# Patient Record
Sex: Male | Born: 1960 | Hispanic: Yes | Marital: Married | Smoking: Never smoker
Health system: Southern US, Community
[De-identification: ages and names within clinical notes are randomized; demographics above are authoritative.]

## PROBLEM LIST (undated history)

## (undated) DIAGNOSIS — E039 Hypothyroidism, unspecified: Secondary | ICD-10-CM

## (undated) DIAGNOSIS — I10 Essential (primary) hypertension: Secondary | ICD-10-CM

## (undated) DIAGNOSIS — E119 Type 2 diabetes mellitus without complications: Secondary | ICD-10-CM

## (undated) DIAGNOSIS — Z87442 Personal history of urinary calculi: Secondary | ICD-10-CM

## (undated) DIAGNOSIS — R0600 Dyspnea, unspecified: Secondary | ICD-10-CM

## (undated) DIAGNOSIS — J45909 Unspecified asthma, uncomplicated: Secondary | ICD-10-CM

## (undated) DIAGNOSIS — K219 Gastro-esophageal reflux disease without esophagitis: Secondary | ICD-10-CM

## (undated) DIAGNOSIS — D126 Benign neoplasm of colon, unspecified: Secondary | ICD-10-CM

## (undated) DIAGNOSIS — D869 Sarcoidosis, unspecified: Secondary | ICD-10-CM

---

## 2011-08-15 DIAGNOSIS — E039 Hypothyroidism, unspecified: Secondary | ICD-10-CM

## 2011-08-15 HISTORY — DX: Hypothyroidism, unspecified: E03.9

## 2013-11-07 ENCOUNTER — Ambulatory Visit (INDEPENDENT_AMBULATORY_CARE_PROVIDER_SITE_OTHER): Payer: BC Managed Care – PPO | Admitting: Podiatry

## 2013-11-07 ENCOUNTER — Encounter: Payer: Self-pay | Admitting: Podiatry

## 2013-11-07 ENCOUNTER — Ambulatory Visit (INDEPENDENT_AMBULATORY_CARE_PROVIDER_SITE_OTHER): Payer: BC Managed Care – PPO

## 2013-11-07 VITALS — BP 136/87 | HR 73 | Resp 16 | Ht 65.0 in | Wt 214.0 lb

## 2013-11-07 DIAGNOSIS — M79672 Pain in left foot: Secondary | ICD-10-CM

## 2013-11-07 DIAGNOSIS — M722 Plantar fascial fibromatosis: Secondary | ICD-10-CM

## 2013-11-07 DIAGNOSIS — M79609 Pain in unspecified limb: Secondary | ICD-10-CM

## 2013-11-07 MED ORDER — TRIAMCINOLONE ACETONIDE 10 MG/ML IJ SUSP
10.0000 mg | Freq: Once | INTRAMUSCULAR | Status: AC
  Administered 2013-11-07: 10 mg

## 2013-11-07 MED ORDER — DICLOFENAC SODIUM 75 MG PO TBEC: 75.0000 mg | DELAYED_RELEASE_TABLET | Freq: Two times a day (BID) | ORAL | Status: DC

## 2013-11-07 NOTE — Progress Notes (Signed)
The right heel and the left ball of foot is hurting

## 2013-11-07 NOTE — Progress Notes (Signed)
Subjective:     Patient ID: Andrew Elliott, male   DOB: 11/02/1960, 53 y.o.   MRN: [id number]  HPI patient states I'm having a lot of pain in my right heel began and I think I need something to wear in my shoes. Also states the left heel has been hurting somewhat   Review of Systems     Objective:   Physical Exam Neurovascular status intact with no health history changes noted and discomfort in the right plantar heel at the insertional point of the tendon into the calcaneus    Assessment:     Continue plantar fasciitis of the heel right over left    Plan:     Reinjected the right plantar fascia 3 mg Kenalog 5 mg Xylocaine Marcaine mixture placed on Voltaren 75 mg twice a day and scanned for custom orthotics to reduce the stress against the heel

## 2013-11-21 ENCOUNTER — Ambulatory Visit (INDEPENDENT_AMBULATORY_CARE_PROVIDER_SITE_OTHER): Payer: BC Managed Care – PPO | Admitting: Podiatry

## 2013-11-21 DIAGNOSIS — M722 Plantar fascial fibromatosis: Secondary | ICD-10-CM

## 2013-11-21 NOTE — Patient Instructions (Signed)

## 2013-11-21 NOTE — Progress Notes (Signed)
Pt presents for orthotic pick up , dispensed orthotics with written and verbal instructions

## 2013-12-16 ENCOUNTER — Ambulatory Visit: Payer: BC Managed Care – PPO | Admitting: Podiatry

## 2013-12-23 ENCOUNTER — Ambulatory Visit (INDEPENDENT_AMBULATORY_CARE_PROVIDER_SITE_OTHER): Payer: BC Managed Care – PPO | Admitting: Podiatry

## 2013-12-23 ENCOUNTER — Encounter: Payer: Self-pay | Admitting: Podiatry

## 2013-12-23 VITALS — BP 122/86 | HR 72 | Resp 16

## 2013-12-23 DIAGNOSIS — M722 Plantar fascial fibromatosis: Secondary | ICD-10-CM

## 2013-12-23 MED ORDER — TRIAMCINOLONE ACETONIDE 10 MG/ML IJ SUSP
10.0000 mg | Freq: Once | INTRAMUSCULAR | Status: AC
  Administered 2013-12-23: 10 mg

## 2013-12-24 NOTE — Progress Notes (Signed)
Subjective:     Patient ID: Andrew Elliott, male   DOB: [birth year], 53 y.o.   MRN: [id number]  HPI patient presents stating my left heel is hurting and at this time my right foot feels better. Patient is relatively difficult to communicate with him points to his arch and his heel left   Review of Systems     Objective:   Physical Exam Neurovascular status intact with no other health history changes noted and discomfort in the left plantar heel at the insertion and moderate pain into the arch of the left    Assessment:     Plantar fasciitis left with arch pain also noted    Plan:     We are continuing to work with this conservatively and today a reinjected the plantar fascia 3 mg Kenalog 5 mm Xylocaine Marcaine mixture encouraged him to break his orthotics and proper leg and continue with his oral anti-inflammatory. Reappoint in 4 weeks

## 2013-12-30 DIAGNOSIS — E039 Hypothyroidism, unspecified: Secondary | ICD-10-CM | POA: Insufficient documentation

## 2014-01-07 ENCOUNTER — Ambulatory Visit: Payer: Self-pay | Admitting: Internal Medicine

## 2014-01-23 ENCOUNTER — Ambulatory Visit: Payer: BC Managed Care – PPO | Admitting: Podiatry

## 2014-01-30 DIAGNOSIS — N433 Hydrocele, unspecified: Secondary | ICD-10-CM | POA: Insufficient documentation

## 2014-10-30 ENCOUNTER — Ambulatory Visit (INDEPENDENT_AMBULATORY_CARE_PROVIDER_SITE_OTHER): Payer: BLUE CROSS/BLUE SHIELD

## 2014-10-30 ENCOUNTER — Ambulatory Visit (INDEPENDENT_AMBULATORY_CARE_PROVIDER_SITE_OTHER): Payer: BLUE CROSS/BLUE SHIELD | Admitting: Podiatry

## 2014-10-30 VITALS — BP 122/81 | HR 67 | Resp 16

## 2014-10-30 DIAGNOSIS — M722 Plantar fascial fibromatosis: Secondary | ICD-10-CM | POA: Diagnosis not present

## 2014-10-30 MED ORDER — TRIAMCINOLONE ACETONIDE 10 MG/ML IJ SUSP
10.0000 mg | Freq: Once | INTRAMUSCULAR | Status: AC
  Administered 2014-10-30: 10 mg

## 2014-10-30 NOTE — Progress Notes (Signed)
Subjective:     Patient ID: Andrew Elliott, male   DOB: 03/13/1961, 54 y.o.   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 Reappoint to recheck again if symptoms indicate

## 2014-11-13 ENCOUNTER — Encounter: Payer: Self-pay | Admitting: Podiatry

## 2015-03-12 DIAGNOSIS — K219 Gastro-esophageal reflux disease without esophagitis: Secondary | ICD-10-CM | POA: Insufficient documentation

## 2015-06-07 ENCOUNTER — Ambulatory Visit
Admission: RE | Admit: 2015-06-07 | Discharge: 2015-06-07 | Disposition: A | Discharge status: Home / Self Care | Payer: BLUE CROSS/BLUE SHIELD | Source: Ambulatory Visit | Attending: Family Medicine | Admitting: Family Medicine

## 2015-06-07 ENCOUNTER — Other Ambulatory Visit: Payer: Self-pay | Admitting: Family Medicine

## 2015-06-07 DIAGNOSIS — R042 Hemoptysis: Secondary | ICD-10-CM

## 2015-06-18 ENCOUNTER — Other Ambulatory Visit: Payer: Self-pay | Admitting: Internal Medicine

## 2015-06-18 DIAGNOSIS — E559 Vitamin D deficiency, unspecified: Secondary | ICD-10-CM | POA: Insufficient documentation

## 2015-06-18 DIAGNOSIS — R7401 Elevation of levels of liver transaminase levels: Secondary | ICD-10-CM

## 2015-06-18 DIAGNOSIS — R7303 Prediabetes: Secondary | ICD-10-CM | POA: Insufficient documentation

## 2015-06-18 DIAGNOSIS — E781 Pure hyperglyceridemia: Secondary | ICD-10-CM | POA: Insufficient documentation

## 2015-06-18 DIAGNOSIS — R740 Nonspecific elevation of levels of transaminase and lactic acid dehydrogenase [LDH]: Principal | ICD-10-CM

## 2015-06-22 ENCOUNTER — Ambulatory Visit
Admission: RE | Admit: 2015-06-22 | Discharge: 2015-06-22 | Disposition: A | Discharge status: Home / Self Care | Payer: BLUE CROSS/BLUE SHIELD | Source: Ambulatory Visit | Attending: Internal Medicine | Admitting: Internal Medicine

## 2015-06-22 DIAGNOSIS — R7401 Elevation of levels of liver transaminase levels: Secondary | ICD-10-CM

## 2015-06-22 DIAGNOSIS — R740 Nonspecific elevation of levels of transaminase and lactic acid dehydrogenase [LDH]: Secondary | ICD-10-CM | POA: Insufficient documentation

## 2015-06-22 IMAGING — US US ABDOMEN LIMITED
1 series · 14 of 25 positions shown · non-contrast
Comparison: None in PACs

CLINICAL DATA: Elevated liver function studies

EXAM:
US ABDOMEN LIMITED - RIGHT UPPER QUADRANT

[Series 1: us abdomen limited · 0.20mm/px · 14 of 77 slices shown]
[im 1/77]
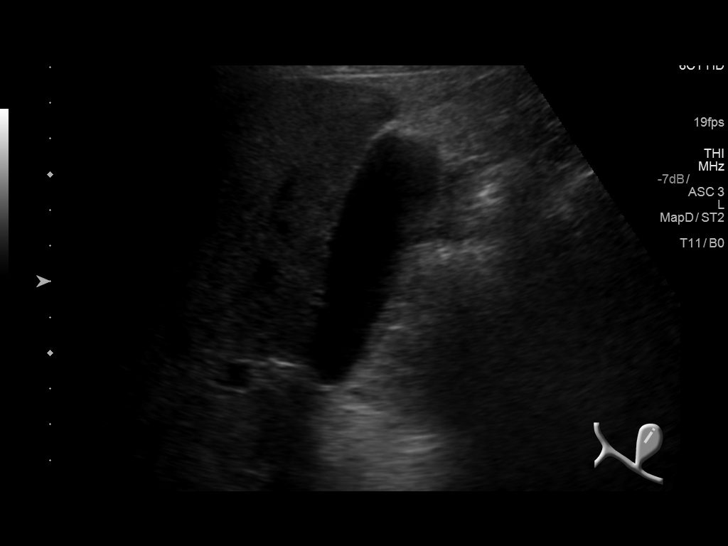
[im 7/77]
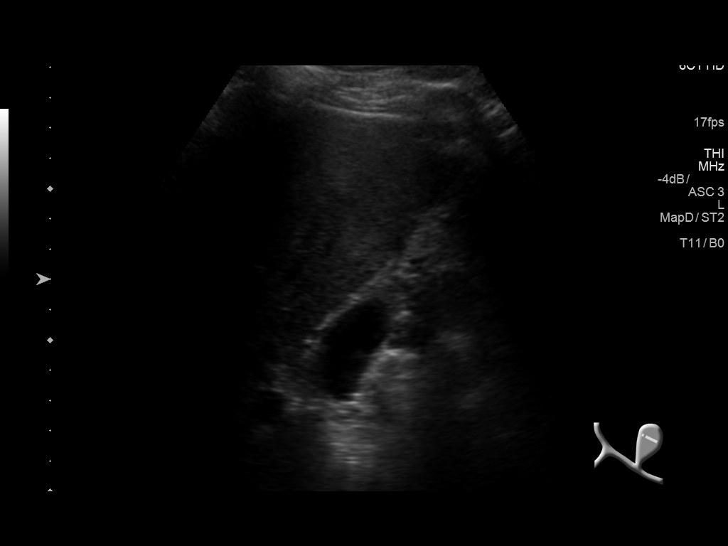
[im 13/77]
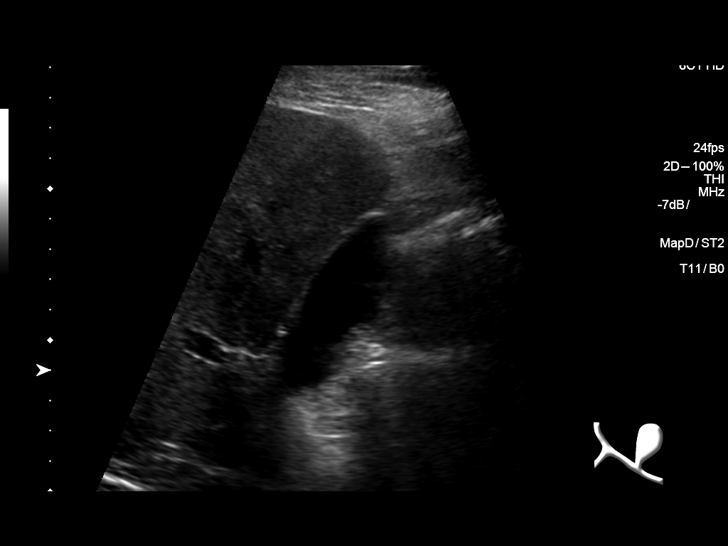
[im 20/77]
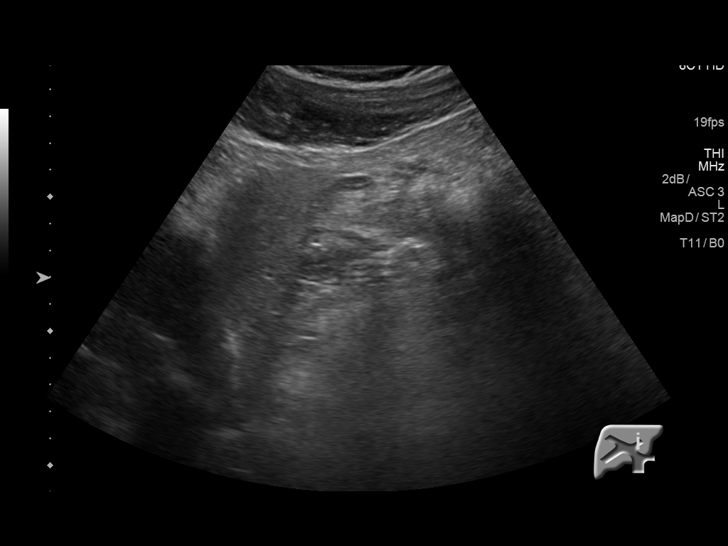
[im 26/77]
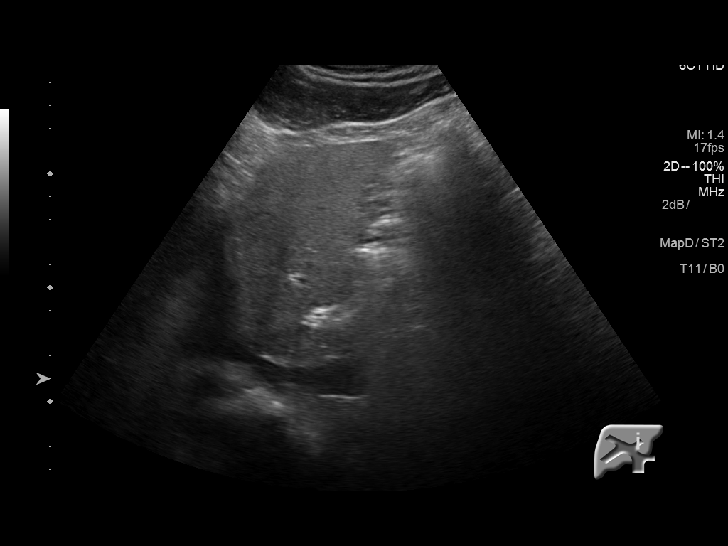
[im 29/77]
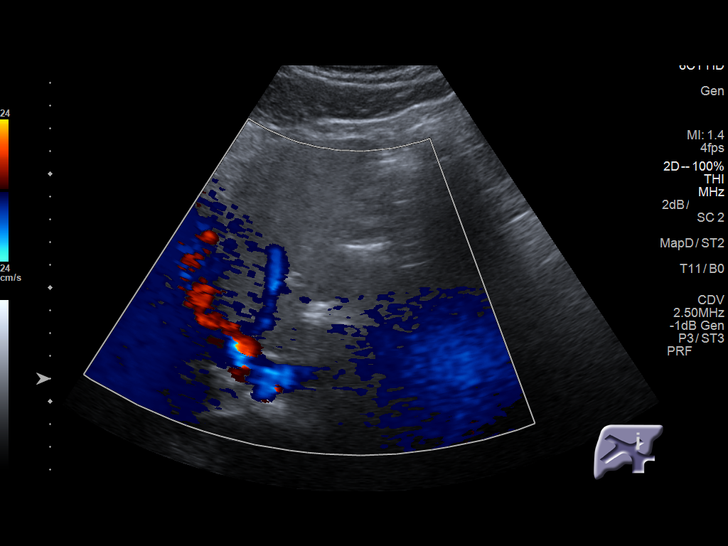
[im 35/77]
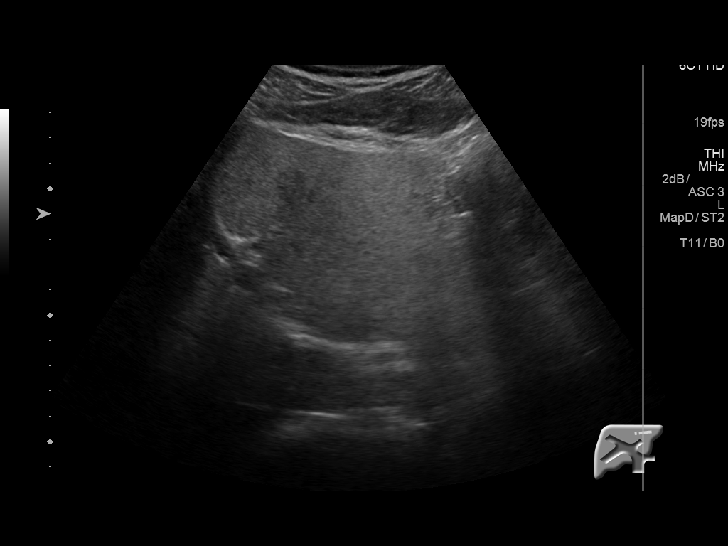
[im 42/77]
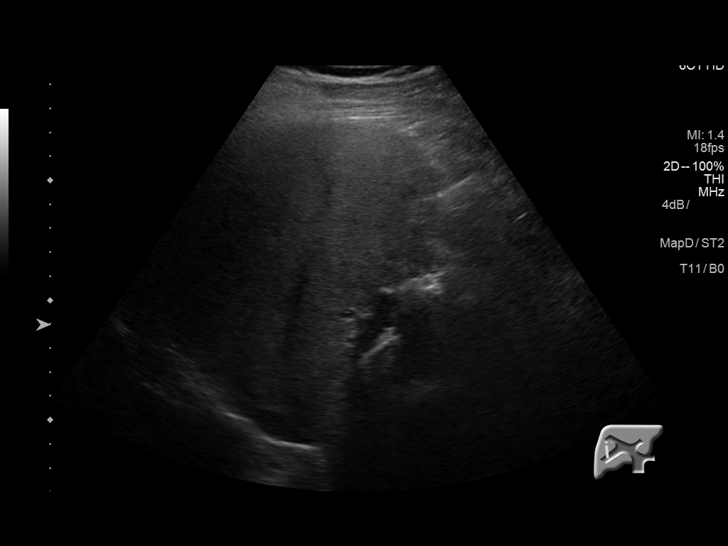
[im 48/77]
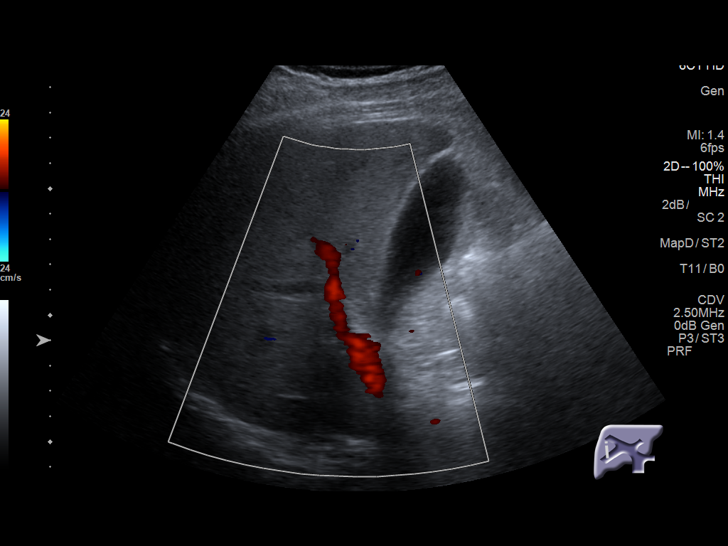
[im 51/77]
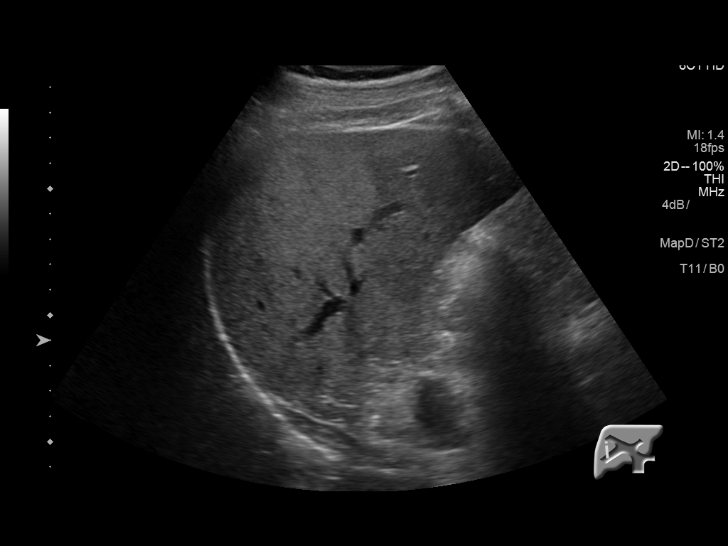
[im 58/77]
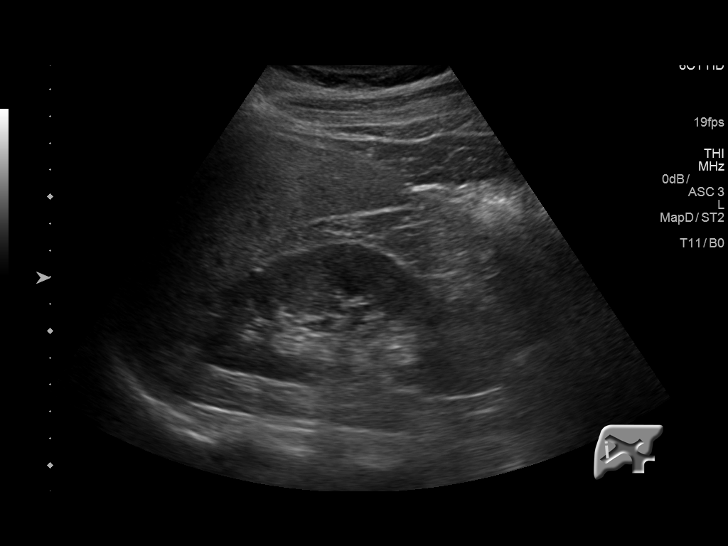
[im 64/77]
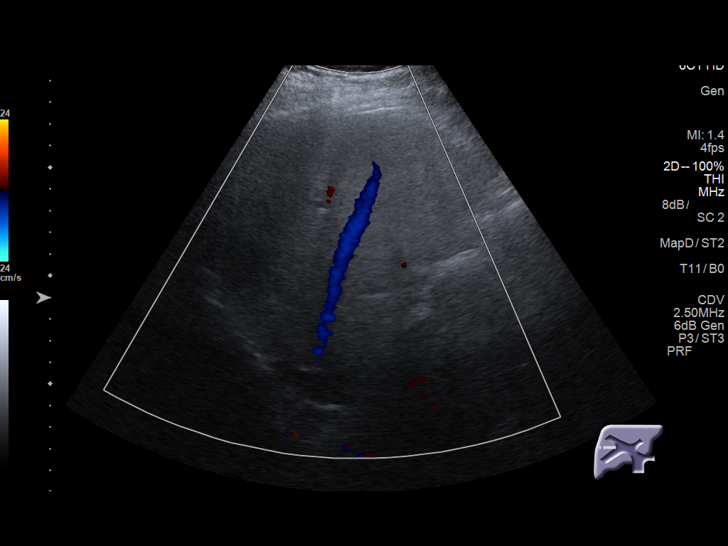
[im 70/77]
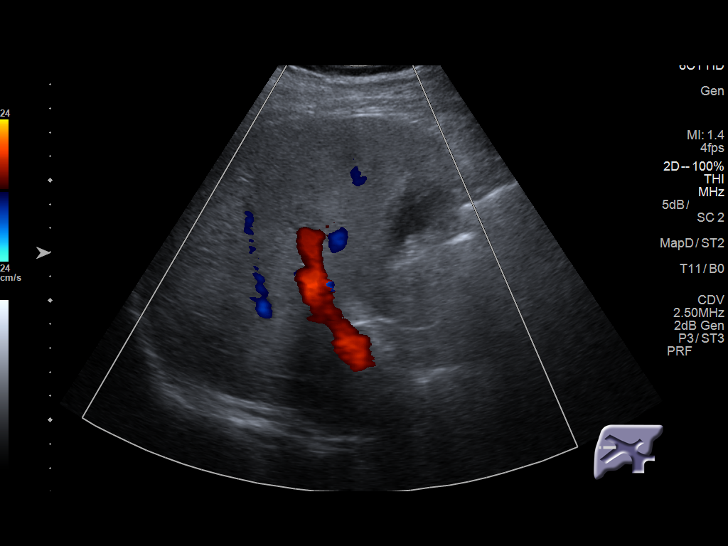
[im 77/77]
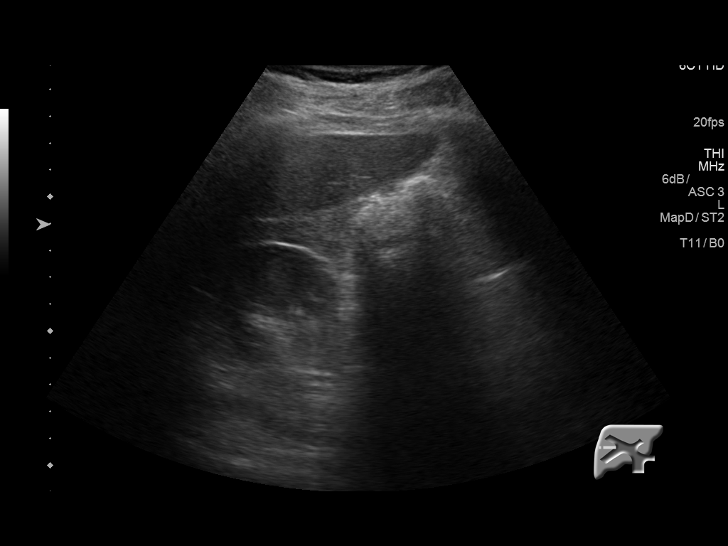

[14 of 25 positions shown; findings below may reference images not displayed]

FINDINGS: Gallbladder:

No gallstones or wall thickening visualized. No sonographic Murphy
sign noted.

Common bile duct:

Diameter: 4 mm

Liver:

The hepatic echotexture is mildly increased diffusely. There is no
focal mass nor ductal dilation.
IMPRESSION: Fatty infiltrative change of the liver. There is no focal hepatic
abnormality. The gallbladder and common bile duct are unremarkable.

## 2015-07-24 ENCOUNTER — Emergency Department
Admission: EM | Admit: 2015-07-24 | Discharge: 2015-07-24 | Disposition: A | Discharge status: Home / Self Care | Payer: Worker's Compensation | Attending: Emergency Medicine | Admitting: Emergency Medicine

## 2015-07-24 ENCOUNTER — Emergency Department: Payer: Worker's Compensation

## 2015-07-24 ENCOUNTER — Encounter: Payer: Self-pay | Admitting: Emergency Medicine

## 2015-07-24 DIAGNOSIS — S0003XA Contusion of scalp, initial encounter: Secondary | ICD-10-CM | POA: Insufficient documentation

## 2015-07-24 DIAGNOSIS — Z791 Long term (current) use of non-steroidal anti-inflammatories (NSAID): Secondary | ICD-10-CM | POA: Insufficient documentation

## 2015-07-24 DIAGNOSIS — S0993XA Unspecified injury of face, initial encounter: Secondary | ICD-10-CM | POA: Insufficient documentation

## 2015-07-24 DIAGNOSIS — W1789XA Other fall from one level to another, initial encounter: Secondary | ICD-10-CM | POA: Diagnosis not present

## 2015-07-24 DIAGNOSIS — Y990 Civilian activity done for income or pay: Secondary | ICD-10-CM | POA: Diagnosis not present

## 2015-07-24 DIAGNOSIS — S0990XA Unspecified injury of head, initial encounter: Secondary | ICD-10-CM | POA: Insufficient documentation

## 2015-07-24 DIAGNOSIS — S0083XA Contusion of other part of head, initial encounter: Secondary | ICD-10-CM | POA: Insufficient documentation

## 2015-07-24 DIAGNOSIS — Z880 Allergy status to penicillin: Secondary | ICD-10-CM | POA: Diagnosis not present

## 2015-07-24 DIAGNOSIS — S199XXA Unspecified injury of neck, initial encounter: Secondary | ICD-10-CM | POA: Diagnosis present

## 2015-07-24 DIAGNOSIS — S161XXA Strain of muscle, fascia and tendon at neck level, initial encounter: Secondary | ICD-10-CM | POA: Diagnosis not present

## 2015-07-24 DIAGNOSIS — Y9389 Activity, other specified: Secondary | ICD-10-CM | POA: Insufficient documentation

## 2015-07-24 DIAGNOSIS — Y9289 Other specified places as the place of occurrence of the external cause: Secondary | ICD-10-CM | POA: Insufficient documentation

## 2015-07-24 DIAGNOSIS — Z79899 Other long term (current) drug therapy: Secondary | ICD-10-CM | POA: Diagnosis not present

## 2015-07-24 DIAGNOSIS — R52 Pain, unspecified: Secondary | ICD-10-CM

## 2015-07-24 MED ORDER — IBUPROFEN 600 MG PO TABS
600.0000 mg | ORAL_TABLET | Freq: Once | ORAL | Status: AC
  Administered 2015-07-24: 600 mg via ORAL
  Filled 2015-07-24: qty 1

## 2015-07-24 MED ORDER — IBUPROFEN 800 MG PO TABS: 800.0000 mg | ORAL_TABLET | Freq: Three times a day (TID) | ORAL | Status: DC

## 2015-07-24 NOTE — ED Provider Notes (Signed)
[facility] Emergency Department Provider Note  ____________________________________________  Time seen: Approximately 10:59 AM  I have reviewed the triage vital signs and the nursing notes.   HISTORY  Chief Complaint Fall   HPI Andrew Elliott is a 54 y.o. male is here with complaint of neck and right jaw pain. Patient also complains of headache and head pain posteriorly and feels dizzy. Patient states that he fell at work yesterday. Dr. Luiz Blare who is the company doctor called the ED prior to patient's arrival stating that patient fell approximately 6 feet onto concrete. Patient states that there was loss of consciousness but denies any nausea, vomiting, visual changes or confusion. Patient states that last evening he took Tylenol and again this morning without any relief. Currently he denies any visual problems and patient is ambulatory in the ER. Currently he rates his pain as a 9 out of 10. He denies any paresthesias.   History reviewed. No pertinent past medical history.  Patient Active Problem List   Diagnosis Date Noted  . Hydrocele 01/30/2014  . Adult hypothyroidism 12/30/2013    History reviewed. No pertinent past surgical history.  Current Outpatient Rx  Name  Route  Sig  Dispense  Refill  . etodolac (LODINE) 500 MG tablet   Oral   Take 500 mg by mouth 2 (two) times daily. As needed         . traMADol (ULTRAM) 50 MG tablet   Oral   Take by mouth every 12 (twelve) hours as needed for moderate pain.         Marland Kitchen diclofenac (VOLTAREN) 75 MG EC tablet   Oral   Take 1 tablet (75 mg total) by mouth 2 (two) times daily.   50 tablet   2   . ibuprofen (ADVIL,MOTRIN) 800 MG tablet   Oral   Take 1 tablet (800 mg total) by mouth 3 (three) times daily.   30 tablet   0   . levothyroxine (SYNTHROID, LEVOTHROID) 75 MCG tablet   Oral   Take by mouth.           Allergies Amoxicillin  No family history on file.  Social History Social  History  Substance Use Topics  . Smoking status: Never Smoker   . Smokeless tobacco: Never Used  . Alcohol Use: No    Review of Systems Constitutional: No fever/chills Eyes: No visual changes. ENT: Right mandible pain positive Cardiovascular: Denies chest pain. Respiratory: Denies shortness of breath. Gastrointestinal:   No nausea, no vomiting.  Musculoskeletal: Negative for back pain. Positive cervical spine pain, positive posterior scalp. Skin: Negative for rash. Neurological: Positive for headaches, no focal weakness or numbness.  10-point ROS otherwise negative.  ____________________________________________   PHYSICAL EXAM:  VITAL SIGNS: ED Triage Vitals  Enc Vitals Group     BP 07/24/15 1012 138/88 mmHg     Pulse Rate 07/24/15 1012 65     Resp 07/24/15 1012 18     Temp 07/24/15 1012 98.2 F (36.8 C)     Temp Source 07/24/15 1012 Oral     SpO2 07/24/15 1012 100 %     Weight 07/24/15 1012 200 lb (90.719 kg)     Height 07/24/15 1012 5\' 7"  (1.702 m)     Head Cir --      Peak Flow --      Pain Score 07/24/15 1012 9     Pain Loc --      Pain Edu? --  Excl. in [place]? --     Constitutional: Alert and oriented. Well appearing and in no acute distress. Eyes: Conjunctivae are normal. PERRL. EOMI. Head: Atraumatic. Nose: No congestion/rhinnorhea. Mouth/Throat: No trauma to teeth. Neck: No stridor.  Mild tenderness on palpation of the posterior cervical spine. Patient was placed in a hard cervical collar. Cardiovascular: Normal rate, regular rhythm. Grossly normal heart sounds.  Good peripheral circulation. Respiratory: Normal respiratory effort.  No retractions. Lungs CTAB. Gastrointestinal: Soft and nontender. No distention. Musculoskeletal: No lower extremity tenderness nor edema.  No joint effusions. Neurologic:  Normal speech and language. No gross focal neurologic deficits are appreciated. No gait instability. Skin:  Skin is warm, dry and intact. No rash  noted. Psychiatric: Mood and affect are normal. Speech and behavior are normal.  ____________________________________________   LABS (all labs ordered are listed, but only abnormal results are displayed)  Labs Reviewed - No data to display  RADIOLOGY Cervical spine CT per radiologist no cervical spine fracture or subluxation. CT maxillofacial per radiologist no facial fractures or mandibular fractures seen. CT head per radiologist no skull fracture or intracranial changes seen.  ____________________________________________   PROCEDURES  Procedure(s) performed: None  Critical Care performed: No  ____________________________________________   INITIAL IMPRESSION / ASSESSMENT AND PLAN / ED COURSE  Pertinent labs & imaging results that were available during my care of the patient were reviewed by me and considered in my medical decision making (see chart for details).  Patient was made aware that his CT findings were negative. He was given ibuprofen while in the emergency room. Patient was given a note for work with restrictions. He is given a prescription for ibuprofen 800 mg 3 times a day. 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 ____________________________________________   FINAL CLINICAL IMPRESSION(S) / ED DIAGNOSES  Final diagnoses:  Pain  Contusion of scalp, initial encounter  Cervical strain, acute, initial encounter  Facial contusion, initial encounter      Johnn Hai, PA-C 07/24/15 1417  Delman Kitten, MD 07/24/15 [phone]

## 2015-07-24 NOTE — ED Notes (Signed)
States he fell yesterday while at work   Hit head  Having pain to neck and jaw. Was dizzy at time of injury. Ambulates well at present

## 2015-07-24 NOTE — ED Notes (Signed)
Patient presents to the ED after falling yesterday morning.  Patient hit the back of his head and states he was very dizzy and unable to stand up after he fell.  Patient is complaining of neck and jaw pain.  Patient denies loss of consciousness.  Patient is ambulatory to triage.  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  Managers came with patient to ED.  Patient denies vomiting, denies confusion.

## 2015-11-29 ENCOUNTER — Ambulatory Visit (INDEPENDENT_AMBULATORY_CARE_PROVIDER_SITE_OTHER): Payer: BLUE CROSS/BLUE SHIELD

## 2015-11-29 ENCOUNTER — Ambulatory Visit (INDEPENDENT_AMBULATORY_CARE_PROVIDER_SITE_OTHER): Payer: BLUE CROSS/BLUE SHIELD | Admitting: Podiatry

## 2015-11-29 ENCOUNTER — Encounter: Payer: Self-pay | Admitting: Podiatry

## 2015-11-29 VITALS — BP 135/81 | HR 72 | Resp 16

## 2015-11-29 DIAGNOSIS — M722 Plantar fascial fibromatosis: Secondary | ICD-10-CM | POA: Diagnosis not present

## 2015-11-29 MED ORDER — TRIAMCINOLONE ACETONIDE 10 MG/ML IJ SUSP
10.0000 mg | Freq: Once | INTRAMUSCULAR | Status: AC
  Administered 2015-11-29: 10 mg

## 2015-11-29 NOTE — Patient Instructions (Signed)

## 2015-11-30 NOTE — Progress Notes (Signed)
Subjective:     Patient ID: Andrew Elliott, male   DOB: 09-May-1961, 55 y.o.   MRN: [id number]  HPI patient presents stating I am having a lot of pain in my left heel at this time. Admits she's not been wearing his orthotics   Review of Systems     Objective:   Physical Exam Neurovascular status intact muscle strength adequate with discomfort in the left plantar heel at the insertional point tendon into the calcaneus with fluid buildup    Assessment:     Acute plantar fasciitis left    Plan:     Physical therapy x-ray reviewed with patient and H&P reviewed. Injected the left plantar fascia 3 mg Kenalog 5 mill grams Xylocaine and applied fascial brace placed on oral anti-inflammatories of diclofenac and instructed on supportive shoes. Reappoint to recheck in 2 weeks  Report indicate small spur with no indications of stress fracture

## 2015-12-16 ENCOUNTER — Ambulatory Visit (INDEPENDENT_AMBULATORY_CARE_PROVIDER_SITE_OTHER): Payer: BLUE CROSS/BLUE SHIELD | Admitting: Podiatry

## 2015-12-16 DIAGNOSIS — M722 Plantar fascial fibromatosis: Secondary | ICD-10-CM

## 2015-12-16 MED ORDER — TRIAMCINOLONE ACETONIDE 10 MG/ML IJ SUSP
10.0000 mg | Freq: Once | INTRAMUSCULAR | Status: AC
  Administered 2015-12-16: 10 mg

## 2015-12-17 NOTE — Progress Notes (Signed)
Subjective:     Patient ID: Andrew Elliott, male   DOB: May 30, 1961, 55 y.o.   MRN: [id number]  HPI patient states my heel is improved but still hurts in one spot   Review of Systems     Objective:   Physical Exam Neurovascular status intact with continued discomfort of the plantar left heel at the insertion calcaneus    Assessment:     Continue plantar fasciitis left    Plan:     Reinjected the plantar fascial left 3 mg Kenalog 5 mg Xylocaine and instructed on physical therapy. Patient will be seen back as needed

## 2016-01-04 ENCOUNTER — Other Ambulatory Visit: Payer: Self-pay | Admitting: Family Medicine

## 2016-01-04 ENCOUNTER — Ambulatory Visit
Admission: RE | Admit: 2016-01-04 | Discharge: 2016-01-04 | Disposition: A | Discharge status: Home / Self Care | Payer: BLUE CROSS/BLUE SHIELD | Source: Ambulatory Visit | Attending: Family Medicine | Admitting: Family Medicine

## 2016-01-04 DIAGNOSIS — M79662 Pain in left lower leg: Secondary | ICD-10-CM

## 2016-01-04 DIAGNOSIS — M79605 Pain in left leg: Secondary | ICD-10-CM

## 2016-01-04 DIAGNOSIS — M7989 Other specified soft tissue disorders: Secondary | ICD-10-CM | POA: Insufficient documentation

## 2016-10-18 ENCOUNTER — Encounter: Payer: Self-pay | Admitting: *Deleted

## 2016-10-19 ENCOUNTER — Ambulatory Visit: Payer: BLUE CROSS/BLUE SHIELD | Admitting: Anesthesiology

## 2016-10-19 ENCOUNTER — Encounter: Payer: Self-pay | Admitting: Anesthesiology

## 2016-10-19 ENCOUNTER — Encounter
Admission: RE | Disposition: A | Discharge status: Home / Self Care | Payer: Self-pay | Source: Ambulatory Visit | Attending: Gastroenterology

## 2016-10-19 ENCOUNTER — Ambulatory Visit
Admission: RE | Admit: 2016-10-19 | Discharge: 2016-10-19 | Disposition: A | Discharge status: Home / Self Care | Payer: BLUE CROSS/BLUE SHIELD | Source: Ambulatory Visit | Attending: Gastroenterology | Admitting: Gastroenterology

## 2016-10-19 DIAGNOSIS — K210 Gastro-esophageal reflux disease with esophagitis: Secondary | ICD-10-CM | POA: Diagnosis not present

## 2016-10-19 DIAGNOSIS — D123 Benign neoplasm of transverse colon: Secondary | ICD-10-CM | POA: Diagnosis not present

## 2016-10-19 DIAGNOSIS — K640 First degree hemorrhoids: Secondary | ICD-10-CM | POA: Insufficient documentation

## 2016-10-19 DIAGNOSIS — D120 Benign neoplasm of cecum: Secondary | ICD-10-CM | POA: Diagnosis not present

## 2016-10-19 DIAGNOSIS — R1013 Epigastric pain: Secondary | ICD-10-CM | POA: Insufficient documentation

## 2016-10-19 DIAGNOSIS — Z1211 Encounter for screening for malignant neoplasm of colon: Secondary | ICD-10-CM | POA: Insufficient documentation

## 2016-10-19 DIAGNOSIS — D124 Benign neoplasm of descending colon: Secondary | ICD-10-CM | POA: Diagnosis not present

## 2016-10-19 DIAGNOSIS — K2950 Unspecified chronic gastritis without bleeding: Secondary | ICD-10-CM | POA: Insufficient documentation

## 2016-10-19 DIAGNOSIS — Z8619 Personal history of other infectious and parasitic diseases: Secondary | ICD-10-CM | POA: Diagnosis not present

## 2016-10-19 DIAGNOSIS — E039 Hypothyroidism, unspecified: Secondary | ICD-10-CM | POA: Diagnosis not present

## 2016-10-19 DIAGNOSIS — Z880 Allergy status to penicillin: Secondary | ICD-10-CM | POA: Insufficient documentation

## 2016-10-19 DIAGNOSIS — I10 Essential (primary) hypertension: Secondary | ICD-10-CM | POA: Insufficient documentation

## 2016-10-19 DIAGNOSIS — D122 Benign neoplasm of ascending colon: Secondary | ICD-10-CM | POA: Diagnosis not present

## 2016-10-19 DIAGNOSIS — Z832 Family history of diseases of the blood and blood-forming organs and certain disorders involving the immune mechanism: Secondary | ICD-10-CM | POA: Insufficient documentation

## 2016-10-19 DIAGNOSIS — Z79899 Other long term (current) drug therapy: Secondary | ICD-10-CM | POA: Insufficient documentation

## 2016-10-19 HISTORY — DX: Gastro-esophageal reflux disease without esophagitis: K21.9

## 2016-10-19 HISTORY — DX: Essential (primary) hypertension: I10

## 2016-10-19 HISTORY — DX: Hypothyroidism, unspecified: E03.9

## 2016-10-19 HISTORY — PX: COLONOSCOPY WITH PROPOFOL: SHX5780

## 2016-10-19 HISTORY — PX: ESOPHAGOGASTRODUODENOSCOPY (EGD) WITH PROPOFOL: SHX5813

## 2016-10-19 SURGERY — COLONOSCOPY WITH PROPOFOL
Anesthesia: General

## 2016-10-19 MED ORDER — PROPOFOL 500 MG/50ML IV EMUL
INTRAVENOUS | Status: DC | PRN
  Administered 2016-10-19: 175 ug/kg/min via INTRAVENOUS

## 2016-10-19 MED ORDER — SODIUM CHLORIDE 0.9 % IV SOLN: INTRAVENOUS | Status: DC

## 2016-10-19 MED ORDER — PROPOFOL 10 MG/ML IV BOLUS
INTRAVENOUS | Status: DC | PRN
  Administered 2016-10-19: 100 mg via INTRAVENOUS

## 2016-10-19 MED ORDER — PROPOFOL 500 MG/50ML IV EMUL
INTRAVENOUS | Status: AC
  Filled 2016-10-19: qty 50

## 2016-10-19 MED ORDER — PHENYLEPHRINE HCL 10 MG/ML IJ SOLN
INTRAMUSCULAR | Status: DC | PRN
  Administered 2016-10-19 (×4): 100 ug via INTRAVENOUS

## 2016-10-19 MED ORDER — SODIUM CHLORIDE 0.9 % IV SOLN
INTRAVENOUS | Status: DC
  Administered 2016-10-19: 1000 mL via INTRAVENOUS
  Administered 2016-10-19: 10:00:00 via INTRAVENOUS

## 2016-10-19 MED ORDER — LIDOCAINE HCL (CARDIAC) 20 MG/ML IV SOLN
INTRAVENOUS | Status: DC | PRN
  Administered 2016-10-19: 100 mg via INTRAVENOUS

## 2016-10-19 NOTE — Anesthesia Postprocedure Evaluation (Signed)
Anesthesia Post Note  Patient: [place]  Procedure(s) Performed: Procedure(s) (LRB): COLONOSCOPY WITH PROPOFOL (N/A) ESOPHAGOGASTRODUODENOSCOPY (EGD) WITH PROPOFOL (N/A)  Patient location during evaluation: Endoscopy Anesthesia Type: General Level of consciousness: awake and alert Pain management: pain level controlled Vital Signs Assessment: post-procedure vital signs reviewed and stable Respiratory status: spontaneous breathing, nonlabored ventilation, respiratory function stable and patient connected to nasal cannula oxygen Cardiovascular status: blood pressure returned to baseline and stable Postop Assessment: no signs of nausea or vomiting Anesthetic complications: no     Last Vitals:  Vitals:   10/19/16 1203 10/19/16 1213  BP:  118/82  Pulse:  64  Resp:  15  Temp: 36.2 C     Last Pain:  Vitals:   10/19/16 1126  TempSrc:   PainSc: 0-No pain                 Ilario Dhaliwal S

## 2016-10-19 NOTE — Transfer of Care (Signed)
Immediate Anesthesia Transfer of Care Note  Patient: Andrew Elliott  Procedure(s) Performed: Procedure(s): COLONOSCOPY WITH PROPOFOL (N/A) ESOPHAGOGASTRODUODENOSCOPY (EGD) WITH PROPOFOL (N/A)  Patient Location: PACU  Anesthesia Type:General  Level of Consciousness: sedated  Airway & Oxygen Therapy: Patient Spontanous Breathing and Patient connected to nasal cannula oxygen  Post-op Assessment: Report given to RN and Post -op Vital signs reviewed and stable  Post vital signs: Reviewed and stable  Last Vitals:  Vitals:   10/19/16 0914  BP: 117/90  Pulse: 72  Resp: 17  Temp: 36.8 C    Last Pain:  Vitals:   10/19/16 0914  TempSrc: Tympanic         Complications: No apparent anesthesia complications

## 2016-10-19 NOTE — H&P (Signed)
Outpatient short stay form Pre-procedure 10/19/2016 10:14 AM Lollie Sails MD  Primary Physician: Dr. Glendon Axe  Reason for visit:  EGD and colonoscopy  History of present illness:  Patient is a 56 year old male presenting today as above. He has a previous history of Helicobacter pylori gastropathy that was treated about a year ago. Continues to have some epigastric discomfort and distress. He does take Nexium. However he also takes multiple different aspirin products including Alka-Seltzer on occasion and possibly other NSAID.    Current Facility-Administered Medications:  .  0.9 %  sodium chloride infusion, , Intravenous, Continuous, Lollie Sails, MD, Last Rate: 20 mL/hr at 10/19/16 0938, 1,000 mL at 10/19/16 0938 .  0.9 %  sodium chloride infusion, , Intravenous, Continuous, Lollie Sails, MD  Prescriptions Prior to Admission  Medication Sig Dispense Refill Last Dose  . etodolac (LODINE) 500 MG tablet Take 500 mg by mouth 2 (two) times daily. As needed   10/18/2016 at Unknown time  . levothyroxine (SYNTHROID, LEVOTHROID) 75 MCG tablet Take by mouth.   10/18/2016 at Unknown time  . Multiple Vitamins-Minerals (CENTRUM SILVER) tablet Take 1 tablet by mouth daily.   10/18/2016 at Unknown time  . pantoprazole (PROTONIX) 40 MG tablet Take 40 mg by mouth 2 (two) times daily.   10/18/2016 at Unknown time  . sucralfate (CARAFATE) 1 GM/10ML suspension Take by mouth.   10/18/2016 at Unknown time  . traMADol (ULTRAM) 50 MG tablet Take by mouth every 12 (twelve) hours as needed for moderate pain.   10/18/2016 at Unknown time  . fluticasone (FLONASE) 50 MCG/ACT nasal spray Place into both nostrils daily.   Not Taking at Unknown time  . ibuprofen (ADVIL,MOTRIN) 800 MG tablet Take 1 tablet (800 mg total) by mouth 3 (three) times daily. (Patient not taking: Reported on 10/19/2016) 30 tablet 0 Not Taking at Unknown time     Allergies  Allergen Reactions  . Amoxicillin Rash     Past Medical  History:  Diagnosis Date  . GERD (gastroesophageal reflux disease)   . Hypertension   . Hypothyroid 2013    Review of systems:      Physical Exam    Heart and lungs: Regular rate and rhythm without rub or gallop, lungs are bilaterally clear.    HEENT: Normocephalic atraumatic eyes are anicteric    Other:     Pertinant exam for procedure: Soft nontender nondistended bowel sounds positive normoactive.    Planned proceedures: EGD, colonoscopy and indicated procedures. I have discussed the risks benefits and complications of procedures to include not limited to bleeding, infection, perforation and the risk of sedation and the patient wishes to proceed.    Lollie Sails, MD Gastroenterology 10/19/2016  10:14 AM

## 2016-10-19 NOTE — Anesthesia Preprocedure Evaluation (Signed)
Anesthesia Evaluation  Patient identified by MRN, date of birth, ID band Patient awake    Reviewed: Allergy & Precautions, NPO status , Patient's Chart, lab work & pertinent test results, reviewed documented beta blocker date and time   Airway Mallampati: II  TM Distance: >3 FB     Dental  (+) Chipped   Pulmonary           Cardiovascular      Neuro/Psych    GI/Hepatic   Endo/Other    Renal/GU      Musculoskeletal   Abdominal   Peds  Hematology   Anesthesia Other Findings   Reproductive/Obstetrics                             Anesthesia Physical Anesthesia Plan  ASA: II  Anesthesia Plan: General   Post-op Pain Management:    Induction: Intravenous  Airway Management Planned:   Additional Equipment:   Intra-op Plan:   Post-operative Plan:   Informed Consent: I have reviewed the patients History and Physical, chart, labs and discussed the procedure including the risks, benefits and alternatives for the proposed anesthesia with the patient or authorized representative who has indicated his/her understanding and acceptance.     Plan Discussed with: CRNA  Anesthesia Plan Comments:         Anesthesia Quick Evaluation

## 2016-10-19 NOTE — Anesthesia Post-op Follow-up Note (Cosign Needed)
Anesthesia QCDR form completed.        

## 2016-10-19 NOTE — Op Note (Signed)
[facility] Gastroenterology Patient Name: Davey Limas Procedure Date: 10/19/2016 10:04 AM MRN: [id number] Account #: [id number] Date of Birth: [birth year] Admit Type: Outpatient Age: 56 Room: [facility] ENDO ROOM 1 Gender: Male Note Status: Finalized Procedure:            Upper GI endoscopy Indications:          Dyspepsia Providers:            Lollie Sails, MD Referring MD:         Glendon Axe (Referring MD) Medicines:            Monitored Anesthesia Care Complications:        No immediate complications. Procedure:            Pre-Anesthesia Assessment:                       - ASA Grade Assessment: II - A patient with mild                        systemic disease.                       After obtaining informed consent, the endoscope was                        passed under direct vision. Throughout the procedure,                        the patient's blood pressure, pulse, and oxygen                        saturations were monitored continuously. The Endoscope                        was introduced through the mouth, and advanced to the                        third part of duodenum. The upper GI endoscopy was                        accomplished without difficulty. The patient tolerated                        the procedure well. Findings:      The Z-line was variable. Biopsies were taken with a cold forceps for       histology.      The exam of the esophagus was otherwise normal.      Diffuse and patchy moderate inflammation characterized by congestion       (edema), erythema and granularity was found in the gastric body.       Biopsies were taken with a cold forceps for histology. Biopsies were       taken with a cold forceps for Helicobacter pylori testing.      Patchy mild inflammation characterized by congestion (edema) and       erythema was found in the gastric antrum.      The examined duodenum was normal. Impression:           - Z-line variable.  Biopsied.                       -  Gastritis. Biopsied.                       - Gastritis.                       - Normal examined duodenum. Recommendation:       - Continue present medications.                       - Await pathology results.                       - Return to GI clinic in 3 weeks. Procedure Code(s):    --- Professional ---                       [phone], Esophagogastroduodenoscopy, flexible, transoral;                        with biopsy, single or multiple Diagnosis Code(s):    --- Professional ---                       K22.8, Other specified diseases of esophagus                       K29.70, Gastritis, unspecified, without bleeding                       R10.13, Epigastric pain CPT copyright 2016 [employer] Medical Association. All rights reserved. The codes documented in this report are preliminary and upon coder review may  be revised to meet current compliance requirements. Lollie Sails, MD 10/19/2016 10:36:35 AM This report has been signed electronically. Number of Addenda: 0 Note Initiated On: 10/19/2016 10:04 AM      [facility]

## 2016-10-19 NOTE — Anesthesia Procedure Notes (Signed)
Date/Time: 10/19/2016 10:24 AM Performed by: Nelda Marseille Pre-anesthesia Checklist: Patient identified, Emergency Drugs available, Suction available, Patient being monitored and Timeout performed Oxygen Delivery Method: Nasal cannula

## 2016-10-19 NOTE — Op Note (Signed)
[facility] Patient Name: Andrew Elliott Procedure Date: 10/19/2016 10:04 AM MRN: [id number] Account #: [id number] Date of Birth: [birth year] Admit Type: Outpatient Age: 56 Room: [facility] ENDO ROOM 1 Gender: Male Note Status: Finalized Procedure:            Colonoscopy Indications:          Screening for colorectal malignant neoplasm Providers:            Lollie Sails, MD Referring MD:         Glendon Axe (Referring MD) Medicines:            Monitored Anesthesia Care Complications:        No immediate complications. Procedure:            Pre-Anesthesia Assessment:                       - ASA Grade Assessment: II - A patient with mild                        systemic disease.                       After obtaining informed consent, the colonoscope was                        passed under direct vision. Throughout the procedure,                        the patient's blood pressure, pulse, and oxygen                        saturations were monitored continuously. The                        Colonoscope was introduced through the anus and                        advanced to the the cecum, identified by appendiceal                        orifice and ileocecal valve. The colonoscopy was                        performed with moderate difficulty due to poor bowel                        prep and a tortuous colon. Successful completion of the                        procedure was aided by applying abdominal pressure and                        lavage. The patient tolerated the procedure well. The                        quality of the bowel preparation was fair. Findings:      A 4 mm polyp was found in the descending colon. The polyp was flat. The       polyp was removed with a cold snare. Resection and retrieval were  complete.      A 4 mm polyp was found in the distal transverse colon. The polyp was       sessile. The polyp was removed with a cold snare.  Resection and       retrieval were complete.      Three flat polyps were found in the cecum. The polyps were 2 to 3 mm in       size. These polyps were removed with a cold biopsy forceps. Resection       and retrieval were complete.      Two sessile polyps were found in the distal ascending colon. The polyps       were 2 to 3 mm in size. These polyps were removed with a cold biopsy       forceps. Resection and retrieval were complete.      A 6 mm polyp was found in the hepatic flexure. The polyp was       pedunculated. The polyp was removed with a cold snare. Resection and       retrieval were complete.      A 2 mm polyp was found in the descending colon. The polyp was sessile.       The polyp was removed with a cold biopsy forceps. Resection and       retrieval were complete.      Non-bleeding internal hemorrhoids were found during anoscopy. The       hemorrhoids were small and Grade I (internal hemorrhoids that do not       prolapse).      The retroflexed view of the distal rectum and anal verge was normal and       showed no anal or rectal abnormalities otherwise. Impression:           - Preparation of the colon was fair.                       - One 4 mm polyp in the descending colon, removed with                        a cold snare. Resected and retrieved.                       - One 4 mm polyp in the distal transverse colon,                        removed with a cold snare. Resected and retrieved.                       - Three 2 to 3 mm polyps in the cecum, removed with a                        cold biopsy forceps. Resected and retrieved.                       - Two 2 to 3 mm polyps in the distal ascending colon,                        removed with a cold biopsy forceps. Resected and                        retrieved.                       -  One 6 mm polyp at the hepatic flexure, removed with a                        cold snare. Resected and retrieved.                       - One 2  mm polyp in the descending colon, removed with                        a cold biopsy forceps. Resected and retrieved.                       - Non-bleeding internal hemorrhoids.                       - The distal rectum and anal verge are normal on                        retroflexion view. Recommendation:       - Await pathology results.                       - Discharge patient to home. Procedure Code(s):    --- Professional ---                       [phone], Colonoscopy, flexible; with removal of tumor(s),                        polyp(s), or other lesion(s) by snare technique                       45380, 83, Colonoscopy, flexible; with biopsy, single                        or multiple Diagnosis Code(s):    --- Professional ---                       Z12.11, Encounter for screening for malignant neoplasm                        of colon                       K64.0, First degree hemorrhoids                       D12.3, Benign neoplasm of transverse colon (hepatic                        flexure or splenic flexure)                       D12.4, Benign neoplasm of descending colon                       D12.0, Benign neoplasm of cecum                       D12.2, Benign neoplasm of ascending colon CPT copyright 2016 American Medical Association. All rights reserved. The codes documented in this report are preliminary and upon coder review may  be revised to meet current  compliance requirements. Lollie Sails, MD 10/19/2016 11:24:41 AM This report has been signed electronically. Number of Addenda: 0 Note Initiated On: 10/19/2016 10:04 AM Scope Withdrawal Time: 0 hours 21 minutes 47 seconds  Total Procedure Duration: 0 hours 38 minutes 20 seconds       [facility]

## 2016-10-20 ENCOUNTER — Encounter: Payer: Self-pay | Admitting: Gastroenterology

## 2016-10-24 LAB — SURGICAL PATHOLOGY

## 2017-05-24 ENCOUNTER — Emergency Department
Admission: EM | Admit: 2017-05-24 | Discharge: 2017-05-24 | Disposition: A | Discharge status: Other Acute Inpatient Hospital | Payer: BLUE CROSS/BLUE SHIELD | Attending: Emergency Medicine | Admitting: Emergency Medicine

## 2017-05-24 ENCOUNTER — Encounter: Payer: Self-pay | Admitting: *Deleted

## 2017-05-24 ENCOUNTER — Encounter (HOSPITAL_COMMUNITY): Payer: Self-pay | Admitting: Emergency Medicine

## 2017-05-24 ENCOUNTER — Emergency Department (HOSPITAL_COMMUNITY)
Admission: EM | Admit: 2017-05-24 | Discharge: 2017-05-24 | Disposition: A | Discharge status: Home / Self Care | Payer: BLUE CROSS/BLUE SHIELD | Attending: Emergency Medicine | Admitting: Emergency Medicine

## 2017-05-24 ENCOUNTER — Emergency Department (HOSPITAL_COMMUNITY): Payer: BLUE CROSS/BLUE SHIELD

## 2017-05-24 DIAGNOSIS — R1031 Right lower quadrant pain: Secondary | ICD-10-CM | POA: Diagnosis present

## 2017-05-24 DIAGNOSIS — I10 Essential (primary) hypertension: Secondary | ICD-10-CM | POA: Diagnosis not present

## 2017-05-24 DIAGNOSIS — Z79899 Other long term (current) drug therapy: Secondary | ICD-10-CM | POA: Diagnosis not present

## 2017-05-24 DIAGNOSIS — K760 Fatty (change of) liver, not elsewhere classified: Secondary | ICD-10-CM | POA: Diagnosis not present

## 2017-05-24 DIAGNOSIS — R1030 Lower abdominal pain, unspecified: Secondary | ICD-10-CM

## 2017-05-24 DIAGNOSIS — E039 Hypothyroidism, unspecified: Secondary | ICD-10-CM | POA: Insufficient documentation

## 2017-05-24 DIAGNOSIS — R109 Unspecified abdominal pain: Secondary | ICD-10-CM | POA: Diagnosis present

## 2017-05-24 LAB — COMPREHENSIVE METABOLIC PANEL
ALK PHOS: 85 U/L (ref 38–126)
ALT: 65 U/L — AB (ref 17–63)
AST: 40 U/L (ref 15–41)
Albumin: 4.3 g/dL (ref 3.5–5.0)
Anion gap: 10 (ref 5–15)
BUN: 20 mg/dL (ref 6–20)
CALCIUM: 9 mg/dL (ref 8.9–10.3)
CHLORIDE: 105 mmol/L (ref 101–111)
CO2: 23 mmol/L (ref 22–32)
CREATININE: 0.95 mg/dL (ref 0.61–1.24)
GFR calc non Af Amer: >60 mL/min (ref >60)
GLUCOSE: 101 mg/dL — AB (ref 65–99)
Potassium: 3.7 mmol/L (ref 3.5–5.1)
SODIUM: 138 mmol/L (ref 135–145)
Total Bilirubin: 0.5 mg/dL (ref 0.3–1.2)
Total Protein: 7.7 g/dL (ref 6.5–8.1)

## 2017-05-24 LAB — CBC
HCT: 44.1 % (ref 40.0–52.0)
Hemoglobin: 15.4 g/dL (ref 13.0–18.0)
MCH: 30.7 pg (ref 26.0–34.0)
MCHC: 34.9 g/dL (ref 32.0–36.0)
MCV: 87.9 fL (ref 80.0–100.0)
PLATELETS: 242 10*3/uL (ref 150–440)
RBC: 5.02 MIL/uL (ref 4.40–5.90)
RDW: 13.3 % (ref 11.5–14.5)
WBC: 9.7 10*3/uL (ref 3.8–10.6)

## 2017-05-24 LAB — LIPASE, BLOOD: LIPASE: 20 U/L (ref 11–51)

## 2017-05-24 MED ORDER — DEXTROSE 5 % IV SOLN
1.0000 g | Freq: Once | INTRAVENOUS | Status: DC
  Filled 2017-05-24: qty 10

## 2017-05-24 MED ORDER — SIMETHICONE 40 MG/0.6ML PO SUSP: 40.0000 mg | Freq: Four times a day (QID) | ORAL | 0 refills | Status: DC | PRN

## 2017-05-24 MED ORDER — OXYCODONE-ACETAMINOPHEN 5-325 MG PO TABS
1.0000 | ORAL_TABLET | Freq: Once | ORAL | Status: AC
  Administered 2017-05-24: 1 via ORAL
  Filled 2017-05-24: qty 1

## 2017-05-24 MED ORDER — METRONIDAZOLE IN NACL 5-0.79 MG/ML-% IV SOLN
500.0000 mg | Freq: Once | INTRAVENOUS | Status: DC
  Administered 2017-05-24: 500 mg via INTRAVENOUS
  Filled 2017-05-24 (×2): qty 100

## 2017-05-24 MED ORDER — ONDANSETRON HCL 4 MG/2ML IJ SOLN
4.0000 mg | Freq: Once | INTRAMUSCULAR | Status: AC
  Administered 2017-05-24: 4 mg via INTRAVENOUS

## 2017-05-24 MED ORDER — ONDANSETRON HCL 4 MG/2ML IJ SOLN
INTRAMUSCULAR | Status: AC
  Administered 2017-05-24: 4 mg via INTRAVENOUS
  Filled 2017-05-24: qty 2

## 2017-05-24 MED ORDER — IOPAMIDOL (ISOVUE-300) INJECTION 61%
INTRAVENOUS | Status: AC
  Filled 2017-05-24: qty 100

## 2017-05-24 MED ORDER — IOPAMIDOL (ISOVUE-300) INJECTION 61%
INTRAVENOUS | Status: AC
  Administered 2017-05-24: 100 mL
  Filled 2017-05-24: qty 100

## 2017-05-24 MED ORDER — PROMETHAZINE HCL 25 MG RE SUPP: 25.0000 mg | Freq: Four times a day (QID) | RECTAL | 0 refills | Status: DC | PRN

## 2017-05-24 MED ORDER — CEFTRIAXONE SODIUM IN DEXTROSE 20 MG/ML IV SOLN
INTRAVENOUS | Status: AC
  Administered 2017-05-24: 1000 mg
  Filled 2017-05-24: qty 50

## 2017-05-24 MED ORDER — MORPHINE SULFATE (PF) 4 MG/ML IV SOLN
INTRAVENOUS | Status: AC
  Administered 2017-05-24: 4 mg via INTRAVENOUS
  Filled 2017-05-24: qty 1

## 2017-05-24 MED ORDER — OXYCODONE-ACETAMINOPHEN 5-325 MG PO TABS: 1.0000 | ORAL_TABLET | Freq: Three times a day (TID) | ORAL | 0 refills | Status: DC | PRN

## 2017-05-24 MED ORDER — MORPHINE SULFATE (PF) 4 MG/ML IV SOLN
4.0000 mg | Freq: Once | INTRAVENOUS | Status: AC
  Administered 2017-05-24: 4 mg via INTRAVENOUS

## 2017-05-24 NOTE — ED Triage Notes (Signed)
Pt transferred from [facility] for CT.  Pt complains of lower quad pain for three days, sometimes he gets nauseas when he eats.
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
## 2017-05-24 NOTE — ED Notes (Signed)
Patient transported to CT 

## 2017-05-24 NOTE — ED Triage Notes (Signed)
PT to ED reporting and pain beginning 3 days ago with nausea and diarrhea last week but normal BM today and no vomiting. PT reports he had severe abd tenderness upon palpation and pain when bending over. Pain and nausea increase when eating. No changes in urine. No fevers reported.

## 2017-05-24 NOTE — ED Notes (Signed)
Report given to [facility]. 
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
## 2017-09-18 ENCOUNTER — Other Ambulatory Visit: Payer: Self-pay | Admitting: Gastroenterology

## 2017-09-18 DIAGNOSIS — R7989 Other specified abnormal findings of blood chemistry: Secondary | ICD-10-CM

## 2017-09-18 DIAGNOSIS — R945 Abnormal results of liver function studies: Principal | ICD-10-CM

## 2017-09-24 ENCOUNTER — Ambulatory Visit: Payer: BLUE CROSS/BLUE SHIELD

## 2017-09-26 ENCOUNTER — Ambulatory Visit: Payer: BLUE CROSS/BLUE SHIELD | Attending: Gastroenterology

## 2017-12-29 ENCOUNTER — Encounter: Payer: Self-pay | Admitting: Emergency Medicine

## 2017-12-29 ENCOUNTER — Emergency Department: Payer: BLUE CROSS/BLUE SHIELD

## 2017-12-29 ENCOUNTER — Emergency Department
Admission: EM | Admit: 2017-12-29 | Discharge: 2017-12-29 | Disposition: A | Discharge status: Home / Self Care | Payer: BLUE CROSS/BLUE SHIELD | Attending: Emergency Medicine | Admitting: Emergency Medicine

## 2017-12-29 ENCOUNTER — Other Ambulatory Visit: Payer: Self-pay

## 2017-12-29 DIAGNOSIS — E039 Hypothyroidism, unspecified: Secondary | ICD-10-CM | POA: Diagnosis not present

## 2017-12-29 DIAGNOSIS — Y9389 Activity, other specified: Secondary | ICD-10-CM | POA: Insufficient documentation

## 2017-12-29 DIAGNOSIS — I10 Essential (primary) hypertension: Secondary | ICD-10-CM | POA: Diagnosis not present

## 2017-12-29 DIAGNOSIS — W11XXXA Fall on and from ladder, initial encounter: Secondary | ICD-10-CM | POA: Diagnosis not present

## 2017-12-29 DIAGNOSIS — S46912A Strain of unspecified muscle, fascia and tendon at shoulder and upper arm level, left arm, initial encounter: Secondary | ICD-10-CM | POA: Diagnosis not present

## 2017-12-29 DIAGNOSIS — Y999 Unspecified external cause status: Secondary | ICD-10-CM | POA: Diagnosis not present

## 2017-12-29 DIAGNOSIS — S4992XA Unspecified injury of left shoulder and upper arm, initial encounter: Secondary | ICD-10-CM | POA: Diagnosis present

## 2017-12-29 DIAGNOSIS — Z79899 Other long term (current) drug therapy: Secondary | ICD-10-CM | POA: Diagnosis not present

## 2017-12-29 DIAGNOSIS — S83401A Sprain of unspecified collateral ligament of right knee, initial encounter: Secondary | ICD-10-CM | POA: Diagnosis not present

## 2017-12-29 DIAGNOSIS — W19XXXA Unspecified fall, initial encounter: Secondary | ICD-10-CM

## 2017-12-29 DIAGNOSIS — Y92009 Unspecified place in unspecified non-institutional (private) residence as the place of occurrence of the external cause: Secondary | ICD-10-CM | POA: Insufficient documentation

## 2017-12-29 MED ORDER — OXYCODONE-ACETAMINOPHEN 5-325 MG PO TABS
1.0000 | ORAL_TABLET | Freq: Once | ORAL | Status: AC
  Administered 2017-12-29: 1 via ORAL
  Filled 2017-12-29: qty 1

## 2017-12-29 MED ORDER — OXYCODONE-ACETAMINOPHEN 7.5-325 MG PO TABS: 1.0000 | ORAL_TABLET | Freq: Four times a day (QID) | ORAL | 0 refills | Status: DC | PRN

## 2017-12-29 MED ORDER — IBUPROFEN 600 MG PO TABS
600.0000 mg | ORAL_TABLET | Freq: Once | ORAL | Status: AC
  Administered 2017-12-29: 600 mg via ORAL
  Filled 2017-12-29: qty 1

## 2017-12-29 MED ORDER — IBUPROFEN 600 MG PO TABS: 600.0000 mg | ORAL_TABLET | Freq: Three times a day (TID) | ORAL | 0 refills | Status: DC | PRN

## 2017-12-29 NOTE — ED Notes (Signed)
Patient transported to X-ray 

## 2017-12-29 NOTE — ED Triage Notes (Signed)
Cleaning gutters today and fell from ladder.  Fall about 4 feet.  C/O Right upper arm pain and right knee pain.  Full ROM demonstrated in triage.  Patient c/o worsening pain with weight bearing on knee.

## 2017-12-29 NOTE — ED Notes (Signed)

## 2017-12-29 NOTE — Discharge Instructions (Signed)
Wear sling and knee support except when sleeping for the next 3 to 4 days.

## 2017-12-29 NOTE — ED Provider Notes (Signed)
[facility] Emergency Department Provider Note   ____________________________________________   First MD Initiated Contact with Patient 12/29/17 1128     (approximate)  I have reviewed the triage vital signs and the nursing notes.   HISTORY  Chief Complaint Fall    HPI Andrew Elliott is a 57 y.o. male patient complain of left upper arm and right knee pain secondary to falling from a ladder.  Patient was cleaning the gutters and fell approximately 4 feet off a ladder.  Patient did pain increased with weightbearing.  Patient does state decreased range of motion left upper extremity secondary to complaint of pain.  Patient points to the midshaft that he was as a source of complaint for arm pain.  Patient rates pain as 9/10.  Patient described the pain is "achy/sore".  No palliative measures prior to arrival.  Past Medical History:  Diagnosis Date  . GERD (gastroesophageal reflux disease)   . Hypertension   . Hypothyroid 2013    Patient Active Problem List   Diagnosis Date Noted  . Hydrocele 01/30/2014  . Adult hypothyroidism 12/30/2013    Past Surgical History:  Procedure Laterality Date  . COLONOSCOPY WITH PROPOFOL N/A 10/19/2016   Procedure: COLONOSCOPY WITH PROPOFOL;  Surgeon: Lollie Sails, MD;  Location: [facility] ENDOSCOPY;  Service: Endoscopy;  Laterality: N/A;  . ESOPHAGOGASTRODUODENOSCOPY (EGD) WITH PROPOFOL N/A 10/19/2016   Procedure: ESOPHAGOGASTRODUODENOSCOPY (EGD) WITH PROPOFOL;  Surgeon: Lollie Sails, MD;  Location: [facility] ENDOSCOPY;  Service: Endoscopy;  Laterality: N/A;    Prior to Admission medications   Medication Sig Start Date End Date Taking? Authorizing Provider  etodolac (LODINE) 500 MG tablet Take 500 mg by mouth 2 (two) times daily.     [provider]  ibuprofen (ADVIL,MOTRIN) 600 MG tablet Take 1 tablet (600 mg total) by mouth every 8 (eight) hours as needed. 12/29/17   Sable Feil, PA-C  Multiple  Vitamins-Minerals (CENTRUM SILVER) tablet Take 1 tablet by mouth daily.    [provider]  oxyCODONE-acetaminophen (PERCOCET) 5-325 MG tablet Take 1-2 tablets by mouth every 8 (eight) hours as needed. 05/24/17   Mesner, Corene Cornea, MD  oxyCODONE-acetaminophen (PERCOCET) 7.5-325 MG tablet Take 1 tablet by mouth every 6 (six) hours as needed for severe pain. 12/29/17   Sable Feil, PA-C  pantoprazole (PROTONIX) 40 MG tablet Take 40 mg by mouth 2 (two) times daily.    [provider]  promethazine (PHENERGAN) 25 MG suppository Place 1 suppository (25 mg total) rectally every 6 (six) hours as needed for nausea or vomiting. 05/24/17   Mesner, Corene Cornea, MD  simethicone (MYLICON) 40 JS/2.8BT drops Take 0.6 mLs (40 mg total) by mouth 4 (four) times daily as needed for flatulence. 05/24/17   Mesner, Corene Cornea, MD  traMADol (ULTRAM) 50 MG tablet Take 50 mg by mouth every 12 (twelve) hours as needed for moderate pain.     [provider]    Allergies Amoxicillin  No family history on file.  Social History Social History   Tobacco Use  . Smoking status: Never Smoker  . Smokeless tobacco: Never Used  Substance Use Topics  . Alcohol use: No  . Drug use: No    Review of Systems Constitutional: No fever/chills Eyes: No visual changes. ENT: No sore throat. Cardiovascular: Denies chest pain. Respiratory: Denies shortness of breath. Gastrointestinal: No abdominal pain.  No nausea, no vomiting.  No diarrhea.  No constipation. Genitourinary: Negative for dysuria. Musculoskeletal: Negative for back pain. Skin: Negative  for rash. Neurological: Negative for headaches, focal weakness or numbness. Endocrine:Hypothyroidism Allergic/Immunilogical: Amoxicillin ____________________________________________   PHYSICAL EXAM:  VITAL SIGNS: ED Triage Vitals  Enc Vitals Group     BP 12/29/17 1109 110/75     Pulse Rate 12/29/17 1109 96     Resp 12/29/17 1109 16     Temp 12/29/17 1109  99.2 F (37.3 C)     Temp Source 12/29/17 1109 Oral     SpO2 12/29/17 1109 100 %     Weight 12/29/17 1108 205 lb (93 kg)     Height 12/29/17 1108 5\' 7"  (1.702 m)     Head Circumference --      Peak Flow --      Pain Score 12/29/17 1107 9     Pain Loc --      Pain Edu? --      Excl. in [place]? --    Constitutional: Alert and oriented. Well appearing and in no acute distress. Eyes: Conjunctivae are normal. PERRL. EOMI. Head: Atraumatic. Nose: No congestion/rhinnorhea. Mouth/Throat: Mucous membranes are moist.  Oropharynx non-erythematous. Neck: No stridor.  No cervical spine tenderness to palpation. Cardiovascular: Normal rate, regular rhythm. Grossly normal heart sounds.  Good peripheral circulation. Respiratory: Normal respiratory effort.  No retractions. Lungs CTAB. Gastrointestinal: Soft and nontender. No distention. No abdominal bruits. No CVA tenderness. Musculoskeletal: No obvious deformity to the left upper arm or right knee.  Patient has full and equal range of motion.  Patient has guarding palpation midshaft of the humerus and anterior right patella. Neurologic:  Normal speech and language. No gross focal neurologic deficits are appreciated. No gait instability. Skin:  Skin is warm, dry and intact. No rash noted. Psychiatric: Mood and affect are normal. Speech and behavior are normal.  ____________________________________________   LABS (all labs ordered are listed, but only abnormal results are displayed)  Labs Reviewed - No data to display ____________________________________________  EKG   ____________________________________________  RADIOLOGY   Official radiology report(s): Dg Knee Complete 4 Views Right  Result Date: 12/29/2017 CLINICAL DATA:  Fall while cleaning gutters today with right anterior knee pain. EXAM: RIGHT KNEE - COMPLETE 4+ VIEW COMPARISON:  None. FINDINGS: No evidence of fracture, dislocation, or joint effusion. No evidence of arthropathy or  other focal bone abnormality. Soft tissues are unremarkable. IMPRESSION: Negative. Electronically Signed   By: Marin Olp M.D.   On: 12/29/2017 11:57   Dg Humerus Left  Result Date: 12/29/2017 CLINICAL DATA:  Fall while cleaning gutters today with left arm pain. EXAM: LEFT HUMERUS - 2+ VIEW COMPARISON:  None. FINDINGS: There is no evidence of fracture or other focal bone lesions. Soft tissues are unremarkable. IMPRESSION: Negative. Electronically Signed   By: Marin Olp M.D.   On: 12/29/2017 11:57    ____________________________________________   PROCEDURES  Procedure(s) performed: None  Procedures  Critical Care performed: No  ____________________________________________   INITIAL IMPRESSION / ASSESSMENT AND PLAN / ED COURSE  As part of my medical decision making, I reviewed the following data within the electronic MEDICAL RECORD NUMBER    Left arm and right knee sprain secondary to fall.  Discussed negative x-ray findings with patient.  Patient given discharge care instruction.  Patient was placed in arm sling and a knee immobilizer.  Advised take medication as directed.  Patient given a work note.  Patient advised to follow-up PCP if no improvement in 3 days.     ____________________________________________   FINAL CLINICAL IMPRESSION(S) / ED DIAGNOSES  Final diagnoses:  Fall, initial encounter  Muscle strain of left upper arm, initial encounter  Sprain of collateral ligament of right knee, initial encounter     ED Discharge Orders        Ordered    oxyCODONE-acetaminophen (PERCOCET) 7.5-325 MG tablet  Every 6 hours PRN     12/29/17 1218    ibuprofen (ADVIL,MOTRIN) 600 MG tablet  Every 8 hours PRN     12/29/17 1218       Note:  This document was prepared using Dragon voice recognition software and may include unintentional dictation errors.    Sable Feil, PA-C 12/29/17 1224    Carrie Mew, MD 12/29/17 [phone]

## 2018-01-11 ENCOUNTER — Other Ambulatory Visit: Payer: Self-pay | Admitting: Surgery

## 2018-01-11 DIAGNOSIS — S83411A Sprain of medial collateral ligament of right knee, initial encounter: Secondary | ICD-10-CM

## 2018-01-11 DIAGNOSIS — S46212A Strain of muscle, fascia and tendon of other parts of biceps, left arm, initial encounter: Secondary | ICD-10-CM

## 2018-01-13 ENCOUNTER — Other Ambulatory Visit: Payer: Self-pay

## 2018-01-18 ENCOUNTER — Ambulatory Visit
Admission: RE | Admit: 2018-01-18 | Discharge: 2018-01-18 | Disposition: A | Discharge status: Home / Self Care | Payer: BLUE CROSS/BLUE SHIELD | Source: Ambulatory Visit | Attending: Surgery | Admitting: Surgery

## 2018-01-18 DIAGNOSIS — S46212A Strain of muscle, fascia and tendon of other parts of biceps, left arm, initial encounter: Secondary | ICD-10-CM

## 2018-01-21 DIAGNOSIS — S46212A Strain of muscle, fascia and tendon of other parts of biceps, left arm, initial encounter: Secondary | ICD-10-CM | POA: Insufficient documentation

## 2018-01-22 ENCOUNTER — Other Ambulatory Visit: Payer: Self-pay | Admitting: Student

## 2018-01-22 DIAGNOSIS — S46212A Strain of muscle, fascia and tendon of other parts of biceps, left arm, initial encounter: Secondary | ICD-10-CM

## 2018-01-23 ENCOUNTER — Encounter
Admission: RE | Disposition: A | Discharge status: Home / Self Care | Payer: Self-pay | Source: Ambulatory Visit | Attending: Surgery

## 2018-01-23 ENCOUNTER — Ambulatory Visit: Payer: BLUE CROSS/BLUE SHIELD | Admitting: Anesthesiology

## 2018-01-23 ENCOUNTER — Ambulatory Visit
Admission: RE | Admit: 2018-01-23 | Discharge: 2018-01-23 | Disposition: A | Discharge status: Home / Self Care | Payer: BLUE CROSS/BLUE SHIELD | Source: Ambulatory Visit | Attending: Surgery | Admitting: Surgery

## 2018-01-23 DIAGNOSIS — I10 Essential (primary) hypertension: Secondary | ICD-10-CM | POA: Diagnosis not present

## 2018-01-23 DIAGNOSIS — E039 Hypothyroidism, unspecified: Secondary | ICD-10-CM | POA: Insufficient documentation

## 2018-01-23 DIAGNOSIS — K219 Gastro-esophageal reflux disease without esophagitis: Secondary | ICD-10-CM | POA: Insufficient documentation

## 2018-01-23 DIAGNOSIS — S46212A Strain of muscle, fascia and tendon of other parts of biceps, left arm, initial encounter: Secondary | ICD-10-CM | POA: Insufficient documentation

## 2018-01-23 DIAGNOSIS — W11XXXA Fall on and from ladder, initial encounter: Secondary | ICD-10-CM | POA: Diagnosis not present

## 2018-01-23 DIAGNOSIS — Z881 Allergy status to other antibiotic agents status: Secondary | ICD-10-CM | POA: Diagnosis not present

## 2018-01-23 DIAGNOSIS — Z886 Allergy status to analgesic agent status: Secondary | ICD-10-CM | POA: Diagnosis not present

## 2018-01-23 DIAGNOSIS — Y93H2 Activity, gardening and landscaping: Secondary | ICD-10-CM | POA: Insufficient documentation

## 2018-01-23 HISTORY — PX: DISTAL BICEPS TENDON REPAIR: SHX1461

## 2018-01-23 SURGERY — REPAIR, TENDON, BICEPS, DISTAL
Anesthesia: General | Site: Arm Upper | Laterality: Left | Wound class: "Clean "

## 2018-01-23 MED ORDER — ONDANSETRON HCL 4 MG/2ML IJ SOLN: 4.0000 mg | Freq: Once | INTRAMUSCULAR | Status: DC | PRN

## 2018-01-23 MED ORDER — ACETAMINOPHEN 160 MG/5ML PO SOLN: 325.0000 mg | ORAL | Status: DC | PRN

## 2018-01-23 MED ORDER — ROPIVACAINE HCL 5 MG/ML IJ SOLN
INTRAMUSCULAR | Status: DC | PRN
  Administered 2018-01-23: 35 mL via PERINEURAL

## 2018-01-23 MED ORDER — OXYCODONE HCL 5 MG PO TABS: 5.0000 mg | ORAL_TABLET | ORAL | Status: DC | PRN

## 2018-01-23 MED ORDER — SODIUM CHLORIDE 0.9 % IV SOLN
900.0000 mg | Freq: Once | INTRAVENOUS | Status: AC
  Administered 2018-01-23: 900 mg via INTRAVENOUS

## 2018-01-23 MED ORDER — OXYCODONE HCL 5 MG PO TABS: 5.0000 mg | ORAL_TABLET | ORAL | 0 refills | Status: DC | PRN

## 2018-01-23 MED ORDER — ONDANSETRON HCL 4 MG PO TABS: 4.0000 mg | ORAL_TABLET | Freq: Four times a day (QID) | ORAL | Status: DC | PRN

## 2018-01-23 MED ORDER — EPHEDRINE SULFATE 50 MG/ML IJ SOLN
INTRAMUSCULAR | Status: DC | PRN
  Administered 2018-01-23 (×2): 10 mg via INTRAVENOUS

## 2018-01-23 MED ORDER — ACETAMINOPHEN 325 MG PO TABS: 325.0000 mg | ORAL_TABLET | ORAL | Status: DC | PRN

## 2018-01-23 MED ORDER — ONDANSETRON HCL 4 MG/2ML IJ SOLN
INTRAMUSCULAR | Status: DC | PRN
  Administered 2018-01-23: 4 mg via INTRAVENOUS

## 2018-01-23 MED ORDER — ONDANSETRON HCL 4 MG/2ML IJ SOLN: 4.0000 mg | Freq: Four times a day (QID) | INTRAMUSCULAR | Status: DC | PRN

## 2018-01-23 MED ORDER — METOCLOPRAMIDE HCL 5 MG/ML IJ SOLN: 5.0000 mg | Freq: Three times a day (TID) | INTRAMUSCULAR | Status: DC | PRN

## 2018-01-23 MED ORDER — OXYCODONE HCL 5 MG/5ML PO SOLN: 5.0000 mg | Freq: Once | ORAL | Status: DC | PRN

## 2018-01-23 MED ORDER — METOCLOPRAMIDE HCL 5 MG PO TABS: 5.0000 mg | ORAL_TABLET | Freq: Three times a day (TID) | ORAL | Status: DC | PRN

## 2018-01-23 MED ORDER — DEXAMETHASONE SODIUM PHOSPHATE 4 MG/ML IJ SOLN
INTRAMUSCULAR | Status: DC | PRN
  Administered 2018-01-23: 4 mg via INTRAVENOUS
  Administered 2018-01-23: 4 mg via PERINEURAL

## 2018-01-23 MED ORDER — FENTANYL CITRATE (PF) 100 MCG/2ML IJ SOLN: 25.0000 ug | INTRAMUSCULAR | Status: DC | PRN

## 2018-01-23 MED ORDER — FENTANYL CITRATE (PF) 100 MCG/2ML IJ SOLN
INTRAMUSCULAR | Status: DC | PRN
  Administered 2018-01-23: 100 ug via INTRAVENOUS

## 2018-01-23 MED ORDER — LACTATED RINGERS IV SOLN
INTRAVENOUS | Status: DC
  Administered 2018-01-23: 11:00:00 via INTRAVENOUS

## 2018-01-23 MED ORDER — POTASSIUM CHLORIDE IN NACL 20-0.9 MEQ/L-% IV SOLN: INTRAVENOUS | Status: DC

## 2018-01-23 MED ORDER — MIDAZOLAM HCL 2 MG/2ML IJ SOLN
INTRAMUSCULAR | Status: DC | PRN
  Administered 2018-01-23: 2 mg via INTRAVENOUS

## 2018-01-23 MED ORDER — OXYCODONE HCL 5 MG PO TABS: 5.0000 mg | ORAL_TABLET | Freq: Once | ORAL | Status: DC | PRN

## 2018-01-23 SURGICAL SUPPLY — 39 items
BANDAGE ACE 4X5 VEL STRL LF (GAUZE/BANDAGES/DRESSINGS) ×6 IMPLANT
BENZOIN TINCTURE PRP APPL 2/3 (GAUZE/BANDAGES/DRESSINGS) ×2 IMPLANT
BNDG COHESIVE 4X5 TAN STRL (GAUZE/BANDAGES/DRESSINGS) ×3 IMPLANT
BNDG ESMARK 4X12 TAN STRL LF (GAUZE/BANDAGES/DRESSINGS) ×3 IMPLANT
CANISTER SUCT 1200ML W/VALVE (MISCELLANEOUS) ×3 IMPLANT
CHLORAPREP W/TINT 26ML (MISCELLANEOUS) ×6 IMPLANT
CLOSURE WOUND 1/2 X4 (GAUZE/BANDAGES/DRESSINGS) ×1
CLOSURE WOUND 1/4X4 (GAUZE/BANDAGES/DRESSINGS) ×1
COVER LIGHT HANDLE UNIVERSAL (MISCELLANEOUS) ×4 IMPLANT
CUFF TOURN SGL QUICK 18 (TOURNIQUET CUFF) ×2 IMPLANT
DRAPE FLUOR MINI C-ARM 54X84 (DRAPES) ×3 IMPLANT
DRAPE U-SHAPE 48X52 POLY STRL (PACKS) ×3 IMPLANT
ELECT REM PT RETURN 9FT ADLT (ELECTROSURGICAL) ×3
ELECTRODE REM PT RTRN 9FT ADLT (ELECTROSURGICAL) ×1 IMPLANT
GAUZE PETRO XEROFOAM 1X8 (MISCELLANEOUS) ×3 IMPLANT
GAUZE SPONGE 4X4 12PLY STRL (GAUZE/BANDAGES/DRESSINGS) ×3 IMPLANT
GLOVE BIO SURGEON STRL SZ8 (GLOVE) ×3 IMPLANT
GLOVE INDICATOR 8.0 STRL GRN (GLOVE) ×3 IMPLANT
GOWN STRL REUS W/ TWL LRG LVL3 (GOWN DISPOSABLE) ×1 IMPLANT
GOWN STRL REUS W/ TWL XL LVL3 (GOWN DISPOSABLE) ×1 IMPLANT
GOWN STRL REUS W/TWL LRG LVL3 (GOWN DISPOSABLE) ×2
GOWN STRL REUS W/TWL XL LVL3 (GOWN DISPOSABLE) ×2
IMPL TOGGLELOC ELBOW SYSTEM (Orthopedic Implant) ×1 IMPLANT
IMPLANT TOGGLELOC ELBOW SYSTEM (Orthopedic Implant) ×3 IMPLANT
KIT TURNOVER KIT A (KITS) ×3 IMPLANT
NS IRRIG 500ML POUR BTL (IV SOLUTION) ×3 IMPLANT
PACK EXTREMITY ARMC (MISCELLANEOUS) ×3 IMPLANT
PAD CAST CTTN 4X4 STRL (SOFTGOODS) ×1 IMPLANT
PADDING CAST COTTON 4X4 STRL (SOFTGOODS) ×4
PENCIL SMOKE EVACUATOR (MISCELLANEOUS) ×2 IMPLANT
SLING ARM LRG DEEP (SOFTGOODS) ×2 IMPLANT
SPLINT CAST 1 STEP 4X30 (MISCELLANEOUS) ×7 IMPLANT
STOCKINETTE IMPERVIOUS 9X36 MD (GAUZE/BANDAGES/DRESSINGS) ×2 IMPLANT
STRIP CLOSURE SKIN 1/2X4 (GAUZE/BANDAGES/DRESSINGS) ×2 IMPLANT
STRIP CLOSURE SKIN 1/4X4 (GAUZE/BANDAGES/DRESSINGS) ×1 IMPLANT
SUT VIC AB 2-0 SH 27 (SUTURE) ×2
SUT VIC AB 2-0 SH 27XBRD (SUTURE) ×1 IMPLANT
SUT VIC AB 3-0 SH 27 (SUTURE) ×2
SUT VIC AB 3-0 SH 27X BRD (SUTURE) ×1 IMPLANT

## 2018-01-23 NOTE — H&P (Signed)
Paper H&P to be scanned into permanent record. H&P reviewed and patient re-examined. No changes. 

## 2018-01-23 NOTE — Discharge Instructions (Signed)
General Anesthesia, Adult, Care After These instructions provide you with information about caring for yourself after your procedure. Your health care provider may also give you more specific instructions. Your treatment has been planned according to current medical practices, but problems sometimes occur. Call your health care provider if you have any problems or questions after your procedure. What can I expect after the procedure? After the procedure, it is common to have:  Vomiting.  A sore throat.  Mental slowness.  It is common to feel:  Nauseous.  Cold or shivery.  Sleepy.  Tired.  Sore or achy, even in parts of your body where you did not have surgery.  Follow these instructions at home: For at least 24 hours after the procedure:  Do not: ? Participate in activities where you could fall or become injured. ? Drive. ? Use heavy machinery. ? Drink alcohol. ? Take sleeping pills or medicines that cause drowsiness. ? Make important decisions or sign legal documents. ? Take care of children on your own.  Rest. Eating and drinking  If you vomit, drink water, juice, or soup when you can drink without vomiting.  Drink enough fluid to keep your urine clear or pale yellow.  Make sure you have little or no nausea before eating solid foods.  Follow the diet recommended by your health care provider. General instructions  Have a responsible adult stay with you until you are awake and alert.  Return to your normal activities as told by your health care provider. Ask your health care provider what activities are safe for you.  Take over-the-counter and prescription medicines only as told by your health care provider.  If you smoke, do not smoke without supervision.  Keep all follow-up visits as told by your health care provider. This is important. Contact a health care provider if:  You continue to have nausea or vomiting at home, and medicines are not helpful.  You  cannot drink fluids or start eating again.  You cannot urinate after 8-12 hours.  You develop a skin rash.  You have fever.  You have increasing redness at the site of your procedure. Get help right away if:  You have difficulty breathing.  You have chest pain.  You have unexpected bleeding.  You feel that you are having a life-threatening or urgent problem. This information is not intended to replace advice given to you by your health care provider. Make sure you discuss any questions you have with your health care provider. Document Released: 11/06/2000 Document Revised: 01/03/2016 Document Reviewed: 07/15/2015 Elsevier Interactive Patient Education  2018 Outlook discharge instructions: Keep splint dry and intact. Keep hand elevated above heart level. Apply ice to affected area frequently. Take ibuprofen 600-800 mg TID with meals for 7-10 days, then as necessary. Take pain medication as prescribed or ES Tylenol when needed.  Return for follow-up in 10-14 days or as scheduled.

## 2018-01-23 NOTE — Anesthesia Postprocedure Evaluation (Signed)
Anesthesia Post Note  Patient: [employer]  Procedure(s) Performed: PRIMARY DISTAL BICEPS TENDON REPAIR TENDON RUPTURE (Left Arm Upper)  Patient location during evaluation: PACU Anesthesia Type: General Level of consciousness: awake and alert and oriented Pain management: satisfactory to patient Vital Signs Assessment: post-procedure vital signs reviewed and stable Respiratory status: spontaneous breathing, nonlabored ventilation and respiratory function stable Cardiovascular status: blood pressure returned to baseline and stable Postop Assessment: Adequate PO intake and No signs of nausea or vomiting Anesthetic complications: no    Raliegh Ip

## 2018-01-23 NOTE — Anesthesia Procedure Notes (Signed)
Procedure Name: LMA Insertion Date/Time: 01/23/2018 11:55 AM Performed by: Cameron Ali, CRNA Pre-anesthesia Checklist: Patient identified, Emergency Drugs available, Suction available, Timeout performed and Patient being monitored Patient Re-evaluated:Patient Re-evaluated prior to induction Oxygen Delivery Method: Circle system utilized Preoxygenation: Pre-oxygenation with 100% oxygen Induction Type: IV induction LMA: LMA inserted LMA Size: 4.0 Number of attempts: 1 Placement Confirmation: positive ETCO2 and breath sounds checked- equal and bilateral Tube secured with: Tape

## 2018-01-23 NOTE — Progress Notes (Signed)
Assisted Mike Stella ANMD with left, ultrasound guided, supraclavicular block. Side rails up, monitors on throughout procedure. See vital signs in flow sheet. Tolerated Procedure well.  

## 2018-01-23 NOTE — Anesthesia Procedure Notes (Signed)
Anesthesia Regional Block: Supraclavicular block   Pre-Anesthetic Checklist: ,, timeout performed, Correct Patient, Correct Site, Correct Laterality, Correct Procedure, Correct Position, site marked, Risks and benefits discussed,  Surgical consent,  Pre-op evaluation,  At surgeon's request and post-op pain management  Laterality: Left  Prep: chloraprep       Needles:  Injection technique: Single-shot  Needle Type: Echogenic Stimulator Needle      Needle Gauge: 21     Additional Needles:   Procedures:, nerve stimulator,,, ultrasound used (permanent image in chart),,,,  Narrative:  Start time: 01/23/2018 10:54 AM End time: 01/23/2018 11:05 AM Injection made incrementally with aspirations every 5 mL.  Performed by: Personally  Anesthesiologist: Ronelle Nigh, MD  Additional Notes: Functioning IV was confirmed and monitors applied.  Sterile prep and drape,hand hygiene and sterile gloves were used.Ultrasound guidance: relevant anatomy identified, needle position confirmed, local anesthetic spread visualized around nerve(s)., vascular puncture avoided.  Image printed for medical record.  Negative aspiration and negative test dose prior to incremental administration of local anesthetic. The patient tolerated the procedure well. Vitals signes recorded in RN notes.

## 2018-01-23 NOTE — Op Note (Signed)
01/23/2018  2:08 PM  Patient:   Andrew Elliott  Pre-Op Diagnosis:   Distal biceps tendon rupture, left elbow.  Post-Op Diagnosis:   Same.  Procedure:   Primary repair of ruptured distal biceps tendon, left elbow.  Surgeon:   Pascal Lux, MD  Assistant:   None  Anesthesia:   General LMA with interscalene block  Findings:   As above.  Complications:   None  EBL:   5 cc  Fluids:   700 cc crystalloid  TT:   69 minutes at 250 mmHg  Drains:   None  Closure:   3-0 Vicryl subcuticular sutures  Implants:   Biomet ToggleLoc 1  Brief Clinical Note:   The patient is a 57 year old male who sustained the above-noted injury nearly 4 weeks ago when he apparently fell backward from a ladder at his home.  As he fell, he tried to hook his left arm around the rung of the ladder to break his fall and felt a sharp pull in his elbow.  Subsequent work-up, including MRI imaging, as confirmed the presence of a distal biceps tendon rupture.  The patient presents this time for definitive management of this injury.  Procedure:   The patient underwent placement of an interscalene block in the preoperative holding area before he was brought into the operating room and lain in the supine position. After adequate general laryngeal mask anesthesia was achieved, the left upper extremity and hand were prepped with DuraPrep solution before being draped sterilely. Preoperative antibiotics were administered. A timeout was performed to verify the appropriate surgical site. Given that the tendon had retracted so proximally, based on both his preoperative MRI scan and by palpation of the anterior aspect of his upper arm, it was felt that the tendon and was too proximal to permit placement of a tourniquet. Therefore, an approximately 6 cm incision, corresponding to the proximal end of the overall standard curvilinear antecubital incision was performed prior to inflation of the tourniquet. The incision was carried  down through the subcutaneous tissues with care taken to identify protect any important neurovascular structures. The anterior upper arm fascia was incised to expose the ruptured and retracted biceps tendon stump. The distal portion of the tendon was debrided of degenerative tissues before a #2 OrthoSorb suture was placed through the tendon in a Krakw type suture technique, incorporating the loop of the Biomet ToggleLoc device. Tension was applied to the suture to be sure that it would not slip later.   Next, the limb was exsanguinated with an Esmarch and the tourniquet inflated to 250 mmHg.  The more distal portion of the curvilinear incision was made over the antecubital fossa. The incision was carried down through the subcutaneous cutaneous tissues with care taken to identify and protect the neurovascular structures in the area. Using blunt dissection, the distal biceps tendon was identified. The bicipital sheath was followed down to the proximal radius using blunt dissection. With care, the bicipital tuberosity was identified by palpation and then dissected free of adjacent soft tissue. A guidewire was placed and its position verified fluoroscopically in AP and lateral projections. The guidewire was overreamed with an 8 mm acorn reamer through the anterior cortex before the posterior cortex was overreamed with a 4.5 mm reamer. The passing sutures of the ToggleLoc anchor were passed through the eye of the Beath needle and the needle brought out posteriorly, pulling the anchor through the radius. It was "set" before the tendon was cinched into the socket with  the elbow flexed to 90. The adequacy of anchor position was verified fluoroscopically in AP and lateral projections and found to be satisfactory. The anterior sutures of the ToggleLoc device were tied securely before being cut short, while the suture pulling the anchor through the radius posteriorly also was removed. The repair was deemed to be stable to  within 30 of extension.  The wound was copiously irrigated with sterile saline solution before the subcutaneous tissues were closed with 2-0 Vicryl interrupted sutures. 3-0 Vicryl subcuticular sutures were used to close the skin before Benzoin and steri-strips were applied to the skin. A sterile bulky dressing was applied to the elbow before the arm was placed into a posterior splint maintaining the elbow at approximately 90 of flexion and in slight supination. The patient was then awakened, extubated, and returned to the recovery room in satisfactory condition after tolerating the procedure well.

## 2018-01-23 NOTE — Anesthesia Preprocedure Evaluation (Signed)
Anesthesia Evaluation  Patient identified by MRN, date of birth, ID band Patient awake    Reviewed: Allergy & Precautions, H&P , NPO status , Patient's Chart, lab work & pertinent test results  Airway Mallampati: II  TM Distance: >3 FB Neck ROM: full    Dental no notable dental hx.    Pulmonary    Pulmonary exam normal breath sounds clear to auscultation       Cardiovascular hypertension, Normal cardiovascular exam Rhythm:regular Rate:Normal     Neuro/Psych    GI/Hepatic GERD  ,  Endo/Other  Hypothyroidism   Renal/GU      Musculoskeletal   Abdominal   Peds  Hematology   Anesthesia Other Findings   Reproductive/Obstetrics                             Anesthesia Physical Anesthesia Plan  ASA: II  Anesthesia Plan: General   Post-op Pain Management:  Regional for Post-op pain   Induction: Intravenous  PONV Risk Score and Plan: 2 and Ondansetron, Propofol infusion, Midazolam and Treatment may vary due to age or medical condition  Airway Management Planned: Natural Airway  Additional Equipment:   Intra-op Plan:   Post-operative Plan:   Informed Consent: I have reviewed the patients History and Physical, chart, labs and discussed the procedure including the risks, benefits and alternatives for the proposed anesthesia with the patient or authorized representative who has indicated his/her understanding and acceptance.     Plan Discussed with: CRNA  Anesthesia Plan Comments:         Anesthesia Quick Evaluation

## 2018-01-23 NOTE — Transfer of Care (Signed)
Immediate Anesthesia Transfer of Care Note  Patient: Andrew Elliott  Procedure(s) Performed: PRIMARY DISTAL BICEPS TENDON REPAIR TENDON RUPTURE (Left Arm Upper)  Patient Location: PACU  Anesthesia Type: General  Level of Consciousness: awake, alert  and patient cooperative  Airway and Oxygen Therapy: Patient Spontanous Breathing and Patient connected to supplemental oxygen  Post-op Assessment: Post-op Vital signs reviewed, Patient's Cardiovascular Status Stable, Respiratory Function Stable, Patent Airway and No signs of Nausea or vomiting  Post-op Vital Signs: Reviewed and stable  Complications: No apparent anesthesia complications

## 2018-01-24 ENCOUNTER — Encounter: Payer: Self-pay | Admitting: Surgery

## 2018-03-19 ENCOUNTER — Other Ambulatory Visit: Payer: Self-pay | Admitting: Gastroenterology

## 2018-03-19 DIAGNOSIS — K760 Fatty (change of) liver, not elsewhere classified: Secondary | ICD-10-CM

## 2018-03-26 ENCOUNTER — Ambulatory Visit: Payer: BLUE CROSS/BLUE SHIELD

## 2018-05-27 ENCOUNTER — Ambulatory Visit: Payer: BLUE CROSS/BLUE SHIELD

## 2018-06-28 DIAGNOSIS — M7582 Other shoulder lesions, left shoulder: Secondary | ICD-10-CM | POA: Insufficient documentation

## 2018-09-06 ENCOUNTER — Encounter: Payer: Self-pay | Admitting: Podiatry

## 2018-09-06 ENCOUNTER — Ambulatory Visit: Payer: BLUE CROSS/BLUE SHIELD | Admitting: Podiatry

## 2018-09-06 ENCOUNTER — Ambulatory Visit (INDEPENDENT_AMBULATORY_CARE_PROVIDER_SITE_OTHER): Payer: BLUE CROSS/BLUE SHIELD

## 2018-09-06 DIAGNOSIS — M722 Plantar fascial fibromatosis: Secondary | ICD-10-CM

## 2018-09-06 MED ORDER — DICLOFENAC SODIUM 75 MG PO TBEC: 75.0000 mg | DELAYED_RELEASE_TABLET | Freq: Two times a day (BID) | ORAL | 2 refills | Status: DC

## 2018-09-06 MED ORDER — TRIAMCINOLONE ACETONIDE 10 MG/ML IJ SUSP
10.0000 mg | Freq: Once | INTRAMUSCULAR | Status: AC
  Administered 2018-09-06: 10 mg

## 2018-09-06 NOTE — Patient Instructions (Addendum)
Plantar Fasciitis (Heel Spur Syndrome) with Rehab The plantar fascia is a fibrous, ligament-like, soft-tissue structure that spans the bottom of the foot. Plantar fasciitis is a condition that causes pain in the foot due to inflammation of the tissue. SYMPTOMS   Pain and tenderness on the underneath side of the foot.  Pain that worsens with standing or walking. CAUSES  Plantar fasciitis is caused by irritation and injury to the plantar fascia on the underneath side of the foot. Common mechanisms of injury include:  Direct trauma to bottom of the foot.  Damage to a small nerve that runs under the foot where the main fascia attaches to the heel bone.  Stress placed on the plantar fascia due to bone spurs. RISK INCREASES WITH:   Activities that place stress on the plantar fascia (running, jumping, pivoting, or cutting).  Poor strength and flexibility.  Improperly fitted shoes.  Tight calf muscles.  Flat feet.  Failure to warm-up properly before activity.  Obesity. PREVENTION  Warm up and stretch properly before activity.  Allow for adequate recovery between workouts.  Maintain physical fitness:  Strength, flexibility, and endurance.  Cardiovascular fitness.  Maintain a health body weight.  Avoid stress on the plantar fascia.  Wear properly fitted shoes, including arch supports for individuals who have flat feet.  PROGNOSIS  If treated properly, then the symptoms of plantar fasciitis usually resolve without surgery. However, occasionally surgery is necessary.  RELATED COMPLICATIONS   Recurrent symptoms that may result in a chronic condition.  Problems of the lower back that are caused by compensating for the injury, such as limping.  Pain or weakness of the foot during push-off following surgery.  Chronic inflammation, scarring, and partial or complete fascia tear, occurring more often from repeated injections.  TREATMENT  Treatment initially involves the  use of ice and medication to help reduce pain and inflammation. The use of strengthening and stretching exercises may help reduce pain with activity, especially stretches of the Achilles tendon. These exercises may be performed at home or with a therapist. Your caregiver may recommend that you use heel cups of arch supports to help reduce stress on the plantar fascia. Occasionally, corticosteroid injections are given to reduce inflammation. If symptoms persist for greater than 6 months despite non-surgical (conservative), then surgery may be recommended.   MEDICATION   If pain medication is necessary, then nonsteroidal anti-inflammatory medications, such as aspirin and ibuprofen, or other minor pain relievers, such as acetaminophen, are often recommended.  Do not take pain medication within 7 days before surgery.  Prescription pain relievers may be given if deemed necessary by your caregiver. Use only as directed and only as much as you need.  Corticosteroid injections may be given by your caregiver. These injections should be reserved for the most serious cases, because they may only be given a certain number of times.  HEAT AND COLD  Cold treatment (icing) relieves pain and reduces inflammation. Cold treatment should be applied for 10 to 15 minutes every 2 to 3 hours for inflammation and pain and immediately after any activity that aggravates your symptoms. Use ice packs or massage the area with a piece of ice (ice massage).  Heat treatment may be used prior to performing the stretching and strengthening activities prescribed by your caregiver, physical therapist, or athletic trainer. Use a heat pack or soak the injury in warm water.  SEEK IMMEDIATE MEDICAL CARE IF:  Treatment seems to offer no benefit, or the condition worsens.  Any medications   produce adverse side effects.  EXERCISES- RANGE OF MOTION (ROM) AND STRETCHING EXERCISES - Plantar Fasciitis (Heel Spur Syndrome) These exercises  may help you when beginning to rehabilitate your injury. Your symptoms may resolve with or without further involvement from your physician, physical therapist or athletic trainer. While completing these exercises, remember:   Restoring tissue flexibility helps normal motion to return to the joints. This allows healthier, less painful movement and activity.  An effective stretch should be held for at least 30 seconds.  A stretch should never be painful. You should only feel a gentle lengthening or release in the stretched tissue.  RANGE OF MOTION - Toe Extension, Flexion  Sit with your right / left leg crossed over your opposite knee.  Grasp your toes and gently pull them back toward the top of your foot. You should feel a stretch on the bottom of your toes and/or foot.  Hold this stretch for 10 seconds.  Now, gently pull your toes toward the bottom of your foot. You should feel a stretch on the top of your toes and or foot.  Hold this stretch for 10 seconds. Repeat  times. Complete this stretch 3 times per day.   RANGE OF MOTION - Ankle Dorsiflexion, Active Assisted  Remove shoes and sit on a chair that is preferably not on a carpeted surface.  Place right / left foot under knee. Extend your opposite leg for support.  Keeping your heel down, slide your right / left foot back toward the chair until you feel a stretch at your ankle or calf. If you do not feel a stretch, slide your bottom forward to the edge of the chair, while still keeping your heel down.  Hold this stretch for 10 seconds. Repeat 3 times. Complete this stretch 2 times per day.   STRETCH  Gastroc, Standing  Place hands on wall.  Extend right / left leg, keeping the front knee somewhat bent.  Slightly point your toes inward on your back foot.  Keeping your right / left heel on the floor and your knee straight, shift your weight toward the wall, not allowing your back to arch.  You should feel a gentle stretch  in the right / left calf. Hold this position for 10 seconds. Repeat 3 times. Complete this stretch 2 times per day.  STRETCH  Soleus, Standing  Place hands on wall.  Extend right / left leg, keeping the other knee somewhat bent.  Slightly point your toes inward on your back foot.  Keep your right / left heel on the floor, bend your back knee, and slightly shift your weight over the back leg so that you feel a gentle stretch deep in your back calf.  Hold this position for 10 seconds. Repeat 3 times. Complete this stretch 2 times per day.  STRETCH  Gastrocsoleus, Standing  Note: This exercise can place a lot of stress on your foot and ankle. Please complete this exercise only if specifically instructed by your caregiver.   Place the ball of your right / left foot on a step, keeping your other foot firmly on the same step.  Hold on to the wall or a rail for balance.  Slowly lift your other foot, allowing your body weight to press your heel down over the edge of the step.  You should feel a stretch in your right / left calf.  Hold this position for 10 seconds.  Repeat this exercise with a slight bend in your right /   left knee. Repeat 3 times. Complete this stretch 2 times per day.   STRENGTHENING EXERCISES - Plantar Fasciitis (Heel Spur Syndrome)  These exercises may help you when beginning to rehabilitate your injury. They may resolve your symptoms with or without further involvement from your physician, physical therapist or athletic trainer. While completing these exercises, remember:   Muscles can gain both the endurance and the strength needed for everyday activities through controlled exercises.  Complete these exercises as instructed by your physician, physical therapist or athletic trainer. Progress the resistance and repetitions only as guided.  STRENGTH - Towel Curls  Sit in a chair positioned on a non-carpeted surface.  Place your foot on a towel, keeping your heel  on the floor.  Pull the towel toward your heel by only curling your toes. Keep your heel on the floor. Repeat 3 times. Complete this exercise 2 times per day.  STRENGTH - Ankle Inversion  Secure one end of a rubber exercise band/tubing to a fixed object (table, pole). Loop the other end around your foot just before your toes.  Place your fists between your knees. This will focus your strengthening at your ankle.  Slowly, pull your big toe up and in, making sure the band/tubing is positioned to resist the entire motion.  Hold this position for 10 seconds.  Have your muscles resist the band/tubing as it slowly pulls your foot back to the starting position. Repeat 3 times. Complete this exercises 2 times per day.  Document Released: 07/31/2005 Document Revised: 10/23/2011 Document Reviewed: 11/12/2008 [facility] Patient Information 2014 [place], [place]. Fascitis plantar Plantar Fasciitis  La fascitis plantar es una afeccin dolorosa que se produce en el taln. Ocurre cuando la banda de tejido que [employer] dedos con el hueso del taln (fascia plantar) se irrita. Esto puede ocurrir por Financial planner ejercicio u otras actividades repetitivas (lesin por uso excesivo). El dolor de la fascitis plantar puede abarcar desde una leve irritacin a dolor intenso, y en los casos ms agudos puede dificultar que la persona camine o se [place]. Por lo general, el dolor es peor a la maana despus de dormir, o despus de Public affairs consultant sentado o acostado durante un tiempo. El dolor tambin puede empeorar despus de caminar o estar de pie por [employer]. Cules son las causas? Esta afeccin puede ser causada por lo siguiente:  Estar de pie durante largos perodos.  Usar zapatos que no tengan un buen soporte para el arco.  Practicar actividades que ponen tensin en las articulaciones (actividades de alto impacto), como correr, Field seismologist [place] [employer] administrator.  Tener sobrepeso.  Tener una  forma de caminar (un andar) anormal.  Presentar rigidez muscular en la parte posterior de la parte inferior de la pierna (pantorrilla).  Tener un arco alto de los pies.  Comenzar una nueva actividad fsica. Cules son los signos o los sntomas? El sntoma principal de esta afeccin es el dolor en el taln. El dolor:  Puede empeorar con los primeros pasos luego de estar en reposo, especialmente por la maana despus de dormir o de haber estado sentado o acostado durante [place].  Puede empeorar despus de largos perodos de estar parado.  Puede disminuir despus de 30 a 45 minutos de [place], como caminar apaciblemente. Cmo se diagnostica? Esta afeccin puede diagnosticarse en funcin de sus antecedentes mdicos y sus sntomas. Su mdico puede preguntarle sobre su nivel de [place]. El Special educational needs teacher un examen fsico para controlar si tiene lo siguiente:  Un  rea dolorida en la parte inferior del pie.  El arco alto.  Dolor al Agricultural consultant.  Dificultad para mover el pie. Pueden realizarle estudios de diagnstico por imagen para confirmar el diagnstico, por ejemplo:  Radiografas.  Ecografa.  Resonancia magntica (RM). Cmo se trata? El tratamiento de la fascitis plantar depende de la gravedad de su afeccin. El tratamiento puede incluir lo siguiente:  Reposo, hielo, presin (compresin) y Lexicographer el pie afectado (elevacin). Esto puede conocerse como [place] de RHCE (Reposo, hielo, compresin, elevacin). El mdico puede recomendarle Terapia de RHCE junto con medicamentos de venta libre para Best boy.  Ejercicios para [place] pantorrillas y la fascia plantar.  Una frula que [employer] estirado y [place] mientras usted duerme (frula nocturna).  Fisioterapia para [employer] sntomas y Customer service manager en el futuro.  Inyecciones de corticoesteroides para Best boy y la inflamacin.  Estimular su fascia plantar lesionada con  impulsos elctricos (tratamiento con ondas de choque extracorprea). Esto generalmente es la ltima opcin de tratamiento antes de la [place].  [place], si los otros tratamientos no han funcionado despus de 27meses. Siga estas indicaciones en su casa:  Control del dolor, la rigidez y la hinchazn  Si se lo indican, aplique hielo sobre la zona del dolor: ? Ponga el hielo en una bolsa de plstico o use una botella de agua congelada. ? Coloque una [employer] piel y la bolsa de hielo o la botella. ? Frote la parte inferior del pie sobre la bolsa o la botella. ? Haga esto durante 8minutos, de 2a 3veces al [employer].  Use calzado deportivo con amortiguacin de aire o gel, o intente usar plantillas blandas diseadas para la fascitis plantar.  Cuando est sentado o acostado, levante (eleve) el pie por encima del nivel del corazn. Actividad  Evite las actividades que causan dolor. Pregntele al mdico qu actividades son seguras para usted.  Haga los ejercicios de fisioterapia y estiramiento como se lo haya indicado el mdico.  Intente hacer actividades y tipos de ejercicio que sean ms fciles para las articulaciones (de bajo impacto). Por ejemplo, nadar, hacer ejercicios [employer], y Catering manager. Instrucciones generales  [employer] de venta libre y los recetados solamente como se lo haya indicado el mdico.  Si el mdico se lo indica, use una frula nocturna para dormir. Afloje la frula si los dedos de los pies se le entumecen, siente hormigueos o se le enfran y se tornan de Optician, dispensing.  Mantenga un peso saludable, o colabore con su mdico para perder [employer].  Concurra a todas las visitas de control como se lo haya indicado el mdico. Esto es importante. Comunquese con un mdico si:  Tiene sntomas que no desaparecen despus tratarlos en su casa.  Siente un dolor que [place].  Siente un dolor que afecta su capacidad de moverse o de Optometrist sus  actividades diarias. Resumen  La fascitis plantar es una afeccin dolorosa que se produce en el taln. Ocurre cuando la banda de tejido que [employer] dedos con el hueso del taln (fascia plantar) se irrita.  El sntoma principal de esta afeccin es Conservation officer, historic buildings en el taln que puede empeorar despus de hacer mucho ejercicio o de Public affairs consultant parado por mucho tiempo.  El tratamiento vara, pero por lo general comienza con reposo, hielo, compresin, y elevacin (Tratamiento de RHCE) y los medicamentos de venta libre para Financial controller. Esta informacin no tiene Psychologist, clinical  consejo del mdico. Asegrese de hacerle al mdico cualquier pregunta que tenga. Document Released: 05/10/2005 Document Revised: 07/22/2017 Document Reviewed: 07/22/2017 Elsevier Interactive Patient Education  2019 [employer].

## 2018-09-09 NOTE — Progress Notes (Signed)
Subjective:   Patient ID: Andrew Elliott, male   DOB: 58 y.o.   MRN: [id number]   HPI Patient presents with exquisite discomfort plantar aspect left heel that is been present for around 6 months.  Has had in the right previously and this is just flared up recently and states it is gradually gotten more aggravating for him   ROS      Objective:  Physical Exam  Neurovascular status intact with exquisite discomfort plantar aspect left heel that did very well for 2-1/2 years with reoccurrence last weekend     Assessment:  Acute plantar fasciitis left plantar heel     Plan:  H&P condition reviewed sterile prep applied and injected the plantar fascial left 3 mg Kenalog 5 mg Xylocaine and advised on physical therapy anti-inflammatory supportive shoe.  Dispense fascial brace with all instructions on usage  X-ray indicates spur with no indications of stress fracture or arthritis

## 2018-09-26 ENCOUNTER — Ambulatory Visit (INDEPENDENT_AMBULATORY_CARE_PROVIDER_SITE_OTHER): Payer: BLUE CROSS/BLUE SHIELD | Admitting: Podiatry

## 2018-09-26 ENCOUNTER — Encounter: Payer: Self-pay | Admitting: Podiatry

## 2018-09-26 DIAGNOSIS — M722 Plantar fascial fibromatosis: Secondary | ICD-10-CM | POA: Diagnosis not present

## 2018-09-26 MED ORDER — TRIAMCINOLONE ACETONIDE 10 MG/ML IJ SUSP
10.0000 mg | Freq: Once | INTRAMUSCULAR | Status: AC
  Administered 2018-09-26: 10 mg

## 2018-09-26 NOTE — Progress Notes (Signed)
Subjective:   Patient ID: Andrew Elliott, male   DOB: 58 y.o.   MRN: [id number]   HPI Patient presents with pain in the plantar heel left stating that it is been hard to walk real well but it is improved from where it was previously   ROS      Objective:  Physical Exam  Neurovascular status intact with patient found to have inflammation plantar aspect left heel at the insertional point of the tendon into the calcaneus with inflammation fluid buildup noted around the medial band     Assessment:  Improvement plantar fasciitis left with pain still present     Plan:  Reinjected the plantar fascial left 3 mg Kenalog 5 g Xylocaine after sterile prep of the area and sterile dressing applied and advised on reduced activity and will be seen back if symptoms persist and may require new orthotics or other treatment

## 2018-11-27 ENCOUNTER — Other Ambulatory Visit: Payer: Self-pay | Admitting: Podiatry

## 2018-11-27 MED ORDER — DICLOFENAC SODIUM 75 MG PO TBEC: 75.0000 mg | DELAYED_RELEASE_TABLET | Freq: Two times a day (BID) | ORAL | 2 refills | Status: DC

## 2019-08-15 DIAGNOSIS — J189 Pneumonia, unspecified organism: Secondary | ICD-10-CM

## 2019-08-15 HISTORY — DX: Pneumonia, unspecified organism: J18.9

## 2019-09-15 DIAGNOSIS — U071 COVID-19: Secondary | ICD-10-CM

## 2019-09-15 HISTORY — DX: COVID-19: U07.1

## 2019-09-27 ENCOUNTER — Encounter: Payer: Self-pay | Admitting: Emergency Medicine

## 2019-09-27 ENCOUNTER — Other Ambulatory Visit: Payer: Self-pay

## 2019-09-27 ENCOUNTER — Emergency Department: Payer: BC Managed Care – PPO

## 2019-09-27 ENCOUNTER — Emergency Department
Admission: EM | Admit: 2019-09-27 | Discharge: 2019-09-27 | Disposition: A | Discharge status: Home / Self Care | Payer: BC Managed Care – PPO | Attending: Emergency Medicine | Admitting: Emergency Medicine

## 2019-09-27 DIAGNOSIS — R509 Fever, unspecified: Secondary | ICD-10-CM | POA: Diagnosis present

## 2019-09-27 DIAGNOSIS — U071 COVID-19: Secondary | ICD-10-CM | POA: Insufficient documentation

## 2019-09-27 DIAGNOSIS — R110 Nausea: Secondary | ICD-10-CM | POA: Insufficient documentation

## 2019-09-27 DIAGNOSIS — I10 Essential (primary) hypertension: Secondary | ICD-10-CM | POA: Insufficient documentation

## 2019-09-27 DIAGNOSIS — Z79899 Other long term (current) drug therapy: Secondary | ICD-10-CM | POA: Insufficient documentation

## 2019-09-27 DIAGNOSIS — R0789 Other chest pain: Secondary | ICD-10-CM | POA: Insufficient documentation

## 2019-09-27 DIAGNOSIS — E039 Hypothyroidism, unspecified: Secondary | ICD-10-CM | POA: Diagnosis not present

## 2019-09-27 LAB — COMPREHENSIVE METABOLIC PANEL
ALT: 38 U/L (ref 0–44)
AST: 38 U/L (ref 15–41)
Albumin: 3.5 g/dL (ref 3.5–5.0)
Alkaline Phosphatase: 59 U/L (ref 38–126)
Anion gap: 12 (ref 5–15)
BUN: 20 mg/dL (ref 8–23)
CO2: 25 mmol/L (ref 22–32)
Calcium: 9 mg/dL (ref 8.9–10.3)
Chloride: 97 mmol/L — ABNORMAL LOW (ref 98–111)
Creatinine, Ser: 0.94 mg/dL (ref 0.61–1.24)
GFR calc Af Amer: >60 mL/min (ref >60)
GFR calc non Af Amer: >60 mL/min (ref >60)
Glucose, Bld: 155 mg/dL — ABNORMAL HIGH (ref 70–99)
Potassium: 3.8 mmol/L (ref 3.5–5.1)
Sodium: 134 mmol/L — ABNORMAL LOW (ref 135–145)
Total Bilirubin: 0.7 mg/dL (ref 0.3–1.2)
Total Protein: 7.3 g/dL (ref 6.5–8.1)

## 2019-09-27 LAB — CBC WITH DIFFERENTIAL/PLATELET
Abs Immature Granulocytes: 0.05 10*3/uL (ref 0.00–0.07)
Basophils Absolute: 0 10*3/uL (ref 0.0–0.1)
Basophils Relative: 0 %
Eosinophils Absolute: 0 10*3/uL (ref 0.0–0.5)
Eosinophils Relative: 0 %
HCT: 45.5 % (ref 39.0–52.0)
Hemoglobin: 15.6 g/dL (ref 13.0–17.0)
Immature Granulocytes: 0 %
Lymphocytes Relative: 5 %
Lymphs Abs: 0.7 10*3/uL (ref 0.7–4.0)
MCH: 30.3 pg (ref 26.0–34.0)
MCHC: 34.3 g/dL (ref 30.0–36.0)
MCV: 88.3 fL (ref 80.0–100.0)
Monocytes Absolute: 0.4 10*3/uL (ref 0.1–1.0)
Monocytes Relative: 3 %
Neutro Abs: 12.9 10*3/uL — ABNORMAL HIGH (ref 1.7–7.7)
Neutrophils Relative %: 92 %
Platelets: 266 10*3/uL (ref 150–400)
RBC: 5.15 MIL/uL (ref 4.22–5.81)
RDW: 12.1 % (ref 11.5–15.5)
WBC: 14.1 10*3/uL — ABNORMAL HIGH (ref 4.0–10.5)
nRBC: 0 % (ref 0.0–0.2)

## 2019-09-27 LAB — TROPONIN I (HIGH SENSITIVITY): Troponin I (High Sensitivity): 5 ng/L (ref <18)

## 2019-09-27 LAB — MAGNESIUM: Magnesium: 1.7 mg/dL (ref 1.7–2.4)

## 2019-09-27 LAB — PROCALCITONIN: Procalcitonin: <0.1 ng/mL

## 2019-09-27 MED ORDER — SODIUM CHLORIDE 0.9 % IV BOLUS
500.0000 mL | Freq: Once | INTRAVENOUS | Status: AC
  Administered 2019-09-27: 11:00:00 500 mL via INTRAVENOUS

## 2019-09-27 MED ORDER — ONDANSETRON HCL 4 MG/2ML IJ SOLN
4.0000 mg | Freq: Once | INTRAMUSCULAR | Status: AC
  Administered 2019-09-27: 4 mg via INTRAVENOUS
  Filled 2019-09-27: qty 2

## 2019-09-27 MED ORDER — ACETAMINOPHEN 500 MG PO TABS
1000.0000 mg | ORAL_TABLET | Freq: Once | ORAL | Status: AC
  Administered 2019-09-27: 1000 mg via ORAL
  Filled 2019-09-27: qty 2

## 2019-09-27 MED ORDER — ONDANSETRON 4 MG PO TBDP: 4.0000 mg | ORAL_TABLET | Freq: Three times a day (TID) | ORAL | 0 refills | Status: DC | PRN

## 2019-09-27 MED ORDER — IOHEXOL 350 MG/ML SOLN
75.0000 mL | Freq: Once | INTRAVENOUS | Status: AC | PRN
  Administered 2019-09-27: 75 mL via INTRAVENOUS

## 2019-09-27 MED ORDER — KETOROLAC TROMETHAMINE 30 MG/ML IJ SOLN
15.0000 mg | Freq: Once | INTRAMUSCULAR | Status: AC
  Administered 2019-09-27: 12:00:00 15 mg via INTRAVENOUS
  Filled 2019-09-27: qty 1

## 2019-09-27 NOTE — ED Triage Notes (Signed)
Pt to ED via POV for fever x 6 days. Pt states that his fever has been as high as 104.0. Pt states that he has taken tylenol last night. Pt was given prednisone and doxycyline yesterday. Pt is COVID positive.

## 2019-09-27 NOTE — Discharge Instructions (Addendum)
Take Tylenol 1 g every 8 hours and ibuprofen 600 every 8 hours with food to help with your fevers and discomfort.  Drink plenty of fluids.  Continue your medications.  We have prescribed some nausea medicine called Zofran.  Return to the ER if you develop worsening shortness of breath, unable to eat or any other concerns

## 2019-09-27 NOTE — ED Provider Notes (Signed)
[facility] Emergency Department Provider Note  ____________________________________________   First MD Initiated Contact with Patient 09/27/19 [id number]     (approximate)  I have reviewed the triage vital signs and the nursing notes.   HISTORY  Chief Complaint Fever    HPI Andrew Elliott is a 59 y.o. male with hypertension who is Covid positive who comes in for fevers.  Patient is on day 9 of symptoms.  Patient was Covid positive on 2/4.  Patient comes in today for continued fever.  Patient last took Tylenol at 1 AM.  Fevers have been intermittent, better after Tylenol, nothing in particular brings it on.  Patient states that this morning he continued to have a fever so he went to get reevaluated.  He also had some nausea and dry heaving but no vomiting this morning.  Denies any abdominal pain.  Denies any urinary symptoms.  Does have a little bit of chest discomfort with coughing.  Does not really feel short of breath.            Past Medical History:  Diagnosis Date  . GERD (gastroesophageal reflux disease)   . Hypertension   . Hypothyroid 2013    Patient Active Problem List   Diagnosis Date Noted  . Rotator cuff tendinitis, left 06/28/2018  . Rupture of left distal biceps tendon 01/21/2018  . Elevated alanine aminotransferase (ALT) level 06/18/2015  . Hypertriglyceridemia 06/18/2015  . Pre-diabetes 06/18/2015  . Vitamin D deficiency 06/18/2015  . Gastroesophageal reflux disease without esophagitis 03/12/2015  . Hydrocele 01/30/2014  . Adult hypothyroidism 12/30/2013    Past Surgical History:  Procedure Laterality Date  . COLONOSCOPY WITH PROPOFOL N/A 10/19/2016   Procedure: COLONOSCOPY WITH PROPOFOL;  Surgeon: Lollie Sails, MD;  Location: [facility] ENDOSCOPY;  Service: Endoscopy;  Laterality: N/A;  . DISTAL BICEPS TENDON REPAIR Left 01/23/2018   Procedure: PRIMARY DISTAL BICEPS TENDON REPAIR TENDON RUPTURE;  Surgeon: Corky Mull, MD;   Location: [place];  Service: Orthopedics;  Laterality: Left;  C ARM OR FLUOROSCAN UNIT BIOMET TOGGLE LOK DEVICE  . ESOPHAGOGASTRODUODENOSCOPY (EGD) WITH PROPOFOL N/A 10/19/2016   Procedure: ESOPHAGOGASTRODUODENOSCOPY (EGD) WITH PROPOFOL;  Surgeon: Lollie Sails, MD;  Location: [facility] ENDOSCOPY;  Service: Endoscopy;  Laterality: N/A;    Prior to Admission medications   Medication Sig Start Date End Date Taking? Authorizing Provider  acetaminophen (TYLENOL) 500 MG tablet Take 500 mg by mouth every 6 (six) hours as needed.    [provider]  diclofenac (VOLTAREN) 75 MG EC tablet Take 1 tablet (75 mg total) by mouth 2 (two) times daily. 11/27/18   Wallene Huh, DPM  dicyclomine (BENTYL) 10 MG capsule TAKE 1 CAPSULE BY MOUTH FOUR TIMES DAILY AS NEEDED 08/26/18   [provider]  gabapentin (NEURONTIN) 100 MG capsule  05/15/18   [provider]  ibuprofen (ADVIL,MOTRIN) 600 MG tablet Take 1 tablet (600 mg total) by mouth every 8 (eight) hours as needed. 12/29/17   Sable Feil, PA-C  Multiple Vitamins-Minerals (CENTRUM SILVER) tablet Take 1 tablet by mouth daily.    [provider]  pantoprazole (PROTONIX) 40 MG tablet Take 40 mg by mouth 2 (two) times daily.    [provider]  traMADol Veatrice Bourbon) 50 MG tablet  09/06/18   [provider]    Allergies Nsaids and Amoxicillin  No family history on file.  Social History Social History   Tobacco Use  . Smoking status: Never Smoker  .  Smokeless tobacco: Never Used  Substance Use Topics  . Alcohol use: No  . Drug use: No      Review of Systems Constitutional: Positive fevers Eyes: No visual changes. ENT: No sore throat. Cardiovascular: Positive chest pain. Respiratory: Denies shortness of breath.  Positive coughing Gastrointestinal: No abdominal pain.  Positive nausea no diarrhea.  No constipation. Genitourinary: Negative for dysuria. Musculoskeletal: Negative for back  pain. Skin: Negative for rash. Neurological: Negative for headaches, focal weakness or numbness. All other ROS negative ____________________________________________   PHYSICAL EXAM:  VITAL SIGNS: ED Triage Vitals  Enc Vitals Group     BP 09/27/19 0906 123/79     Pulse Rate 09/27/19 0906 (!) 102     Resp 09/27/19 0906 18     Temp 09/27/19 0906 (!) 101.8 F (38.8 C)     Temp Source 09/27/19 0906 Oral     SpO2 09/27/19 0906 94 %     Weight 09/27/19 0907 200 lb (90.7 kg)     Height 09/27/19 0907 5\' 7"  (1.702 m)     Head Circumference --      Peak Flow --      Pain Score 09/27/19 0907 10     Pain Loc --      Pain Edu? --      Excl. in [place]? --     Constitutional: Alert and oriented. Well appearing and in no acute distress.  Ambulated patient around the room.  Normal work of breathing.  Saturations 94 to 96% Eyes: Conjunctivae are normal. EOMI. Head: Atraumatic. Nose: No congestion/rhinnorhea. Mouth/Throat: Mucous membranes are moist.   Neck: No stridor. Trachea Midline. FROM Cardiovascular: Normal rate, regular rhythm. Grossly normal heart sounds.  Good peripheral circulation. Respiratory: Normal respiratory effort.  No retractions. Lungs CTAB. Gastrointestinal: Soft and nontender. No distention. No abdominal bruits.  Musculoskeletal: No lower extremity tenderness nor edema.  No joint effusions. Neurologic:  Normal speech and language. No gross focal neurologic deficits are appreciated.  Skin:  Skin is warm, dry and intact. No rash noted. Psychiatric: Mood and affect are normal. Speech and behavior are normal. GU: Deferred   ____________________________________________   LABS (all labs ordered are listed, but only abnormal results are displayed)  Labs Reviewed  CBC WITH DIFFERENTIAL/PLATELET - Abnormal; Notable for the following components:      Result Value   WBC 14.1 (*)    Neutro Abs 12.9 (*)    All other components within normal limits  COMPREHENSIVE METABOLIC  PANEL - Abnormal; Notable for the following components:   Sodium 134 (*)    Chloride 97 (*)    Glucose, Bld 155 (*)    All other components within normal limits  PROCALCITONIN  MAGNESIUM  URINALYSIS, ROUTINE W REFLEX MICROSCOPIC  TROPONIN I (HIGH SENSITIVITY)  TROPONIN I (HIGH SENSITIVITY)   ____________________________________________   ED ECG REPORT I, Vanessa [place], the attending physician, personally viewed and interpreted this ECG.  EKG is normal sinus rate of 85, no ST elevation, no T wave inversion, normal intervals ____________________________________________  RADIOLOGY Robert Bellow, personally viewed and evaluated these images (plain radiographs) as part of my medical decision making, as well as reviewing the written report by the radiologist.  ED MD interpretation: X-ray consistent with Covid Official radiology report(s): CT Angio Chest PE W and/or Wo Contrast  Result Date: 09/27/2019 CLINICAL DATA:  Shortness of breath for several days with fevers, history of COVID-19 positivity EXAM: CT ANGIOGRAPHY CHEST WITH CONTRAST TECHNIQUE: Multidetector CT imaging  of the chest was performed using the standard protocol during bolus administration of intravenous contrast. Multiplanar CT image reconstructions and MIPs were obtained to evaluate the vascular anatomy. CONTRAST:  41mL OMNIPAQUE IOHEXOL 350 MG/ML SOLN COMPARISON:  Chest x-ray from earlier in the same day. FINDINGS: Cardiovascular: Thoracic aorta demonstrates no evidence of aneurysmal dilatation or dissection. No cardiac enlargement is noted. The pulmonary artery shows a normal branching pattern. No filling defects are identified to suggest pulmonary emboli. No significant coronary calcifications are seen. Mediastinum/Nodes: Thoracic inlet is within normal limits. No sizable hilar or mediastinal adenopathy is noted. The esophagus as visualized is within normal limits. Lungs/Pleura: Lungs are well aerated bilaterally. Patchy  ground-glass opacities are noted consistent with the given clinical history of COVID-19 positivity. No sizable effusion is seen. No parenchymal nodules are seen. Upper Abdomen: Visualized upper abdomen is within normal limits. Musculoskeletal: No chest wall abnormality. No acute or significant osseous findings. Review of the MIP images confirms the above findings. IMPRESSION: No evidence of pulmonary emboli. Patchy opacities bilaterally similar to that seen on prior chest x-ray consistent with the given clinical history of COVID-19 positivity. Electronically Signed   By: Inez Catalina M.D.   On: 09/27/2019 12:25   DG Chest Portable 1 View  Result Date: 09/27/2019 CLINICAL DATA:  COVID positive. Fever for 6 days. EXAM: PORTABLE CHEST 1 VIEW COMPARISON:  Chest x-ray dated 06/07/2015. FINDINGS: Prominence of the upper mediastinal width, possibly related to patient positioning. Borderline cardiomegaly, also possibly accentuated by patient positioning. Patchy subtle opacities at the level the RIGHT mid lung and at the LEFT lung base. No pleural effusion or pneumothorax is seen. Osseous structures about the chest are unremarkable. IMPRESSION: 1. Subtle patchy airspace opacities bilaterally, suspicious for multifocal pneumonia. 2. Prominence of the upper mediastinal width, possibly related to patient positioning, possibly indicating mediastinal mass or lymphadenopathy. 3. Recommend chest CT for further characterization of both the mediastinal and pulmonary findings. Electronically Signed   By: Franki Cabot M.D.   On: 09/27/2019 10:18    ____________________________________________   PROCEDURES  Procedure(s) performed (including Critical Care):  Procedures   ____________________________________________   INITIAL IMPRESSION / ASSESSMENT AND PLAN / ED [place] was evaluated in Emergency Department on 09/27/2019 for the symptoms described in the history of present illness. He was evaluated  in the context of the global COVID-19 pandemic, which necessitated consideration that the patient might be at risk for infection with the SARS-CoV-2 virus that causes COVID-19. Institutional protocols and algorithms that pertain to the evaluation of patients at risk for COVID-19 are in a state of rapid change based on information released by regulatory bodies including the CDC and federal and state organizations. These policies and algorithms were followed during the patient's care in the ED.     Patient is a well-appearing 59 year old who is known Covid positive for the past 9 days coming in with continued fevers.  I suspect this is still likely from his Covid.  Patient has no abdominal pain to suggest abdominal infection.  No urinary symptoms to suggest UTI.  Will get chest x-ray procalcitonin to evaluate for bacterial coinfection pneumonia.  Patient has no wheezing to suggest COPD or asthma flare.  Will get labs to evaluate for electrolyte abnormalities, AKI.  Low suspicion for PE given he is not feeling short of breath is not hypoxic.  Reevaluated patient.  States he is feeling okay.  Chest x-ray with concern for possible prominence in the upper mediastinum concerning  for mass versus lymphadenopathy.  Will get CT PE to further characterize.  White count is slightly elevated but patient is on steroids and procalcitonin was negative.  At this time I have low suspicion for bacterial pneumonia.  No signs of ACS.  CT PE negative.  We ambulated patient and get saturations never went below 92% did not actually feel short of breath.  Patient is already on steroids and doxycycline.  Discussed with patient his reassuring results and to continue his medications and to return to the ER if he develops worsening shortness of breath.  I discussed the provisional nature of ED diagnosis, the treatment so far, the ongoing plan of care, follow up appointments and return precautions with the patient and any family or  support people present. They expressed understanding and agreed with the plan, discharged home.    ____________________________________________   FINAL CLINICAL IMPRESSION(S) / ED DIAGNOSES   Final diagnoses:  COVID-19      MEDICATIONS GIVEN DURING THIS VISIT:  Medications  acetaminophen (TYLENOL) tablet 1,000 mg (1,000 mg Oral Given 09/27/19 1055)  ondansetron (ZOFRAN) injection 4 mg (4 mg Intravenous Given 09/27/19 1055)  sodium chloride 0.9 % bolus 500 mL (0 mLs Intravenous Stopped 09/27/19 1218)  ketorolac (TORADOL) 30 MG/ML injection 15 mg (15 mg Intravenous Given 09/27/19 1220)  iohexol (OMNIPAQUE) 350 MG/ML injection 75 mL (75 mLs Intravenous Contrast Given 09/27/19 1204)     ED Discharge Orders         Ordered    ondansetron (ZOFRAN ODT) 4 MG disintegrating tablet  Every 8 hours PRN     09/27/19 1310           Note:  This document was prepared using Dragon voice recognition software and may include unintentional dictation errors.   Vanessa [place], MD 09/27/19 1321

## 2019-09-29 ENCOUNTER — Emergency Department: Payer: BC Managed Care – PPO

## 2019-09-29 ENCOUNTER — Emergency Department
Admission: EM | Admit: 2019-09-29 | Discharge: 2019-09-30 | Disposition: A | Discharge status: Other Acute Inpatient Hospital | Payer: BC Managed Care – PPO | Attending: Emergency Medicine | Admitting: Emergency Medicine

## 2019-09-29 ENCOUNTER — Other Ambulatory Visit: Payer: Self-pay

## 2019-09-29 ENCOUNTER — Encounter: Payer: Self-pay | Admitting: *Deleted

## 2019-09-29 DIAGNOSIS — R0602 Shortness of breath: Secondary | ICD-10-CM | POA: Diagnosis present

## 2019-09-29 DIAGNOSIS — Z880 Allergy status to penicillin: Secondary | ICD-10-CM | POA: Diagnosis not present

## 2019-09-29 DIAGNOSIS — Z886 Allergy status to analgesic agent status: Secondary | ICD-10-CM | POA: Diagnosis not present

## 2019-09-29 DIAGNOSIS — E039 Hypothyroidism, unspecified: Secondary | ICD-10-CM | POA: Insufficient documentation

## 2019-09-29 DIAGNOSIS — I10 Essential (primary) hypertension: Secondary | ICD-10-CM | POA: Insufficient documentation

## 2019-09-29 DIAGNOSIS — U071 COVID-19: Secondary | ICD-10-CM | POA: Diagnosis not present

## 2019-09-29 DIAGNOSIS — R7303 Prediabetes: Secondary | ICD-10-CM | POA: Diagnosis not present

## 2019-09-29 DIAGNOSIS — J9601 Acute respiratory failure with hypoxia: Secondary | ICD-10-CM | POA: Diagnosis not present

## 2019-09-29 DIAGNOSIS — E781 Pure hyperglyceridemia: Secondary | ICD-10-CM | POA: Insufficient documentation

## 2019-09-29 DIAGNOSIS — Z79899 Other long term (current) drug therapy: Secondary | ICD-10-CM | POA: Diagnosis not present

## 2019-09-29 LAB — CBC
HCT: 45.4 % (ref 39.0–52.0)
Hemoglobin: 15.6 g/dL (ref 13.0–17.0)
MCH: 30 pg (ref 26.0–34.0)
MCHC: 34.4 g/dL (ref 30.0–36.0)
MCV: 87.3 fL (ref 80.0–100.0)
Platelets: 315 10*3/uL (ref 150–400)
RBC: 5.2 MIL/uL (ref 4.22–5.81)
RDW: 12 % (ref 11.5–15.5)
WBC: 10.2 10*3/uL (ref 4.0–10.5)
nRBC: 0 % (ref 0.0–0.2)

## 2019-09-29 LAB — BASIC METABOLIC PANEL
Anion gap: 9 (ref 5–15)
BUN: 19 mg/dL (ref 8–23)
CO2: 26 mmol/L (ref 22–32)
Calcium: 8.5 mg/dL — ABNORMAL LOW (ref 8.9–10.3)
Chloride: 99 mmol/L (ref 98–111)
Creatinine, Ser: 0.9 mg/dL (ref 0.61–1.24)
GFR calc Af Amer: >60 mL/min (ref >60)
GFR calc non Af Amer: >60 mL/min (ref >60)
Glucose, Bld: 116 mg/dL — ABNORMAL HIGH (ref 70–99)
Potassium: 3.7 mmol/L (ref 3.5–5.1)
Sodium: 134 mmol/L — ABNORMAL LOW (ref 135–145)

## 2019-09-29 LAB — BRAIN NATRIURETIC PEPTIDE: B Natriuretic Peptide: 48 pg/mL (ref 0.0–100.0)

## 2019-09-29 LAB — PROCALCITONIN: Procalcitonin: 0.12 ng/mL

## 2019-09-29 LAB — FERRITIN: Ferritin: 394 ng/mL — ABNORMAL HIGH (ref 24–336)

## 2019-09-29 LAB — FIBRINOGEN: Fibrinogen: >750 mg/dL — ABNORMAL HIGH (ref 210–475)

## 2019-09-29 LAB — LACTIC ACID, PLASMA: Lactic Acid, Venous: 1.3 mmol/L (ref 0.5–1.9)

## 2019-09-29 LAB — TRIGLYCERIDES: Triglycerides: 97 mg/dL (ref <150)

## 2019-09-29 LAB — TROPONIN I (HIGH SENSITIVITY): Troponin I (High Sensitivity): 3 ng/L (ref <18)

## 2019-09-29 LAB — FIBRIN DERIVATIVES D-DIMER (ARMC ONLY): Fibrin derivatives D-dimer (ARMC): 756.14 ng/mL (FEU) — ABNORMAL HIGH (ref 0.00–499.00)

## 2019-09-29 LAB — LACTATE DEHYDROGENASE: LDH: 303 U/L — ABNORMAL HIGH (ref 98–192)

## 2019-09-29 MED ORDER — SODIUM CHLORIDE 0.9% FLUSH: 3.0000 mL | Freq: Once | INTRAVENOUS | Status: DC

## 2019-09-29 MED ORDER — DEXAMETHASONE SODIUM PHOSPHATE 10 MG/ML IJ SOLN
6.0000 mg | Freq: Once | INTRAMUSCULAR | Status: AC
  Administered 2019-09-29: 23:00:00 6 mg via INTRAVENOUS
  Filled 2019-09-29: qty 1

## 2019-09-29 MED ORDER — ACETAMINOPHEN 325 MG PO TABS
650.0000 mg | ORAL_TABLET | Freq: Once | ORAL | Status: AC
  Administered 2019-09-29: 650 mg via ORAL
  Filled 2019-09-29: qty 2

## 2019-09-29 NOTE — ED Provider Notes (Signed)
[facility] Emergency Department Provider Note  ____________________________________________   First MD Initiated Contact with Patient 09/29/19 2140     (approximate)  I have reviewed the triage vital signs and the nursing notes.   HISTORY  Chief Complaint Shortness of Breath and Chest Pain    HPI Andrew Elliott is a 59 y.o. male who is Covid positive on 2/4 who comes in for continued shortness of breath now on day 11 of symptoms.  Patient was seen by myself 2 days ago.  At that time patient was not hypoxic.  Patient already had a CT PE that was negative.  Patient states that he has been taking his medications but came in for worsening shortness of breath.  Shortness of breath severe, worse with exertion, better at rest.          Past Medical History:  Diagnosis Date  . GERD (gastroesophageal reflux disease)   . Hypertension   . Hypothyroid 2013    Patient Active Problem List   Diagnosis Date Noted  . Rotator cuff tendinitis, left 06/28/2018  . Rupture of left distal biceps tendon 01/21/2018  . Elevated alanine aminotransferase (ALT) level 06/18/2015  . Hypertriglyceridemia 06/18/2015  . Pre-diabetes 06/18/2015  . Vitamin D deficiency 06/18/2015  . Gastroesophageal reflux disease without esophagitis 03/12/2015  . Hydrocele 01/30/2014  . Adult hypothyroidism 12/30/2013    Past Surgical History:  Procedure Laterality Date  . COLONOSCOPY WITH PROPOFOL N/A 10/19/2016   Procedure: COLONOSCOPY WITH PROPOFOL;  Surgeon: Lollie Sails, MD;  Location: [facility] ENDOSCOPY;  Service: Endoscopy;  Laterality: N/A;  . DISTAL BICEPS TENDON REPAIR Left 01/23/2018   Procedure: PRIMARY DISTAL BICEPS TENDON REPAIR TENDON RUPTURE;  Surgeon: Corky Mull, MD;  Location: [place];  Service: Orthopedics;  Laterality: Left;  C ARM OR FLUOROSCAN UNIT BIOMET TOGGLE LOK DEVICE  . ESOPHAGOGASTRODUODENOSCOPY (EGD) WITH PROPOFOL N/A 10/19/2016   Procedure:  ESOPHAGOGASTRODUODENOSCOPY (EGD) WITH PROPOFOL;  Surgeon: Lollie Sails, MD;  Location: [facility] ENDOSCOPY;  Service: Endoscopy;  Laterality: N/A;    Prior to Admission medications   Medication Sig Start Date End Date Taking? Authorizing Provider  acetaminophen (TYLENOL) 500 MG tablet Take 500 mg by mouth every 6 (six) hours as needed.    [provider]  diclofenac (VOLTAREN) 75 MG EC tablet Take 1 tablet (75 mg total) by mouth 2 (two) times daily. 11/27/18   Wallene Huh, DPM  dicyclomine (BENTYL) 10 MG capsule TAKE 1 CAPSULE BY MOUTH FOUR TIMES DAILY AS NEEDED 08/26/18   [provider]  gabapentin (NEURONTIN) 100 MG capsule  05/15/18   [provider]  ibuprofen (ADVIL,MOTRIN) 600 MG tablet Take 1 tablet (600 mg total) by mouth every 8 (eight) hours as needed. 12/29/17   Sable Feil, PA-C  Multiple Vitamins-Minerals (CENTRUM SILVER) tablet Take 1 tablet by mouth daily.    [provider]  ondansetron (ZOFRAN ODT) 4 MG disintegrating tablet Take 1 tablet (4 mg total) by mouth every 8 (eight) hours as needed for nausea or vomiting. 09/27/19   Vanessa [place], MD  pantoprazole (PROTONIX) 40 MG tablet Take 40 mg by mouth 2 (two) times daily.    [provider]  traMADol Veatrice Bourbon) 50 MG tablet  09/06/18   [provider]    Allergies Nsaids and Amoxicillin  History reviewed. No pertinent family history.  Social History Social History   Tobacco Use  . Smoking status: Never Smoker  . Smokeless tobacco: Never Used  Substance Use Topics  . Alcohol use: No  . Drug use: No      Review of Systems Constitutional: Positive fevers Eyes: No visual changes. ENT: No sore throat. Cardiovascular: Positive chest pain Respiratory: Positive for SOB, positive cough Gastrointestinal: No abdominal pain.  No nausea, no vomiting.  No diarrhea.  No constipation. Genitourinary: Negative for dysuria. Musculoskeletal: Negative for back pain. Skin:  Negative for rash. Neurological: Negative for headaches, focal weakness or numbness. All other ROS negative ____________________________________________   PHYSICAL EXAM:  VITAL SIGNS: ED Triage Vitals [09/29/19 2122]  Enc Vitals Group     BP 105/66     Pulse Rate 90     Resp 20     Temp (!) 100.9 F (38.3 C)     Temp Source Oral     SpO2 (!) 87 %     Weight 199 lb 15.3 oz (90.7 kg)     Height 5\' 7"  (1.702 m)     Head Circumference      Peak Flow      Pain Score 10     Pain Loc      Pain Edu?      Excl. in [place]?     Constitutional: Alert and oriented. Well appearing and in no acute distress. Eyes: Conjunctivae are normal. EOMI. Head: Atraumatic. Nose: No congestion/rhinnorhea. Mouth/Throat: Mucous membranes are moist.   Neck: No stridor. Trachea Midline. FROM Cardiovascular: Normal rate, regular rhythm. Grossly normal heart sounds.  Good peripheral circulation. Respiratory: On 2 L, no stridor, no increased work of breathing Gastrointestinal: Soft and nontender. No distention. No abdominal bruits.  Musculoskeletal: No lower extremity tenderness nor edema.  No joint effusions. Neurologic:  Normal speech and language. No gross focal neurologic deficits are appreciated.  Skin:  Skin is warm, dry and intact. No rash noted. Psychiatric: Mood and affect are normal. Speech and behavior are normal. GU: Deferred   ____________________________________________   LABS (all labs ordered are listed, but only abnormal results are displayed)  Labs Reviewed  BASIC METABOLIC PANEL - Abnormal; Notable for the following components:      Result Value   Sodium 134 (*)    Glucose, Bld 116 (*)    Calcium 8.5 (*)    All other components within normal limits  CULTURE, BLOOD (ROUTINE X 2)  CULTURE, BLOOD (ROUTINE X 2)  CBC  LACTIC ACID, PLASMA  LACTIC ACID, PLASMA  FIBRIN DERIVATIVES D-DIMER ([facility] ONLY)  PROCALCITONIN  LACTATE DEHYDROGENASE  FERRITIN  TRIGLYCERIDES  FIBRINOGEN    C-REACTIVE PROTEIN  BRAIN NATRIURETIC PEPTIDE  TROPONIN I (HIGH SENSITIVITY)   ____________________________________________   ED ECG REPORT I, Vanessa [place], the attending physician, personally viewed and interpreted this ECG.  EKG is normal sinus rhythm 91, no ST elevation, no T wave inversions, normal intervals ____________________________________________  RADIOLOGY Robert Bellow, personally viewed and evaluated these images (plain radiographs) as part of my medical decision making, as well as reviewing the written report by the radiologist.  ED MD interpretation: Bilateral opacities consistent with Covid  Official radiology report(s): DG Chest Portable 1 View  Result Date: 09/29/2019 CLINICAL DATA:  Shortness of breath with chest pain EXAM: PORTABLE CHEST 1 VIEW COMPARISON:  09/27/2019, 06/07/2015 FINDINGS: Streaky interstitial and patchy ground-glass opacities consistent with bilateral pneumonia. No pleural effusion. Low lung volumes. Cardiomediastinal silhouette within normal limits. No pneumothorax. IMPRESSION: Low lung volumes. Radiographically stable bilateral interstitial and ground-glass opacities, felt to represent viral pneumonia. Electronically Signed   By: Maudie Mercury  Francoise Ceo M.D.   On: 09/29/2019 21:59    ____________________________________________   PROCEDURES  Procedure(s) performed (including Critical Care):  .Critical Care Performed by: Vanessa [place], MD Authorized by: Vanessa [place], MD   Critical care provider statement:    Critical care time (minutes):  45   Critical care was necessary to treat or prevent imminent or life-threatening deterioration of the following conditions:  Respiratory failure   Critical care was time spent personally by me on the following activities:  Discussions with consultants, evaluation of patient's response to treatment, examination of patient, ordering and performing treatments and interventions, ordering and review of laboratory  studies, ordering and review of radiographic studies, pulse oximetry, re-evaluation of patient's condition, obtaining history from patient or surrogate and review of old charts     ____________________________________________   INITIAL IMPRESSION / ASSESSMENT AND PLAN / ED [place] was evaluated in Emergency Department on 09/29/2019 for the symptoms described in the history of present illness. He was evaluated in the context of the global COVID-19 pandemic, which necessitated consideration that the patient might be at risk for infection with the SARS-CoV-2 virus that causes COVID-19. Institutional protocols and algorithms that pertain to the evaluation of patients at risk for COVID-19 are in a state of rapid change based on information released by regulatory bodies including the CDC and federal and state organizations. These policies and algorithms were followed during the patient's care in the ED.     Pt presents with SOB.  Most likely secondary to Covid. PNA-will get xray to evaluation Anemia-CBC to evaluate ACS- will get trops Arrhythmia-Will get EKG and keep on monitor.  COVID- will get testing per algorithm. PE-lower suspicion given no risk factors and other cause more likely and recent CT PE was negative  We will give a dose of Decadron and remdesivir.  Patient is on 2 L.  Will work on patient being admitted   ____________________________________________   FINAL CLINICAL IMPRESSION(S) / ED DIAGNOSES   Final diagnoses:  COVID-19  Acute respiratory failure with hypoxia ([place])     MEDICATIONS GIVEN DURING THIS VISIT:  Medications  sodium chloride flush (NS) 0.9 % injection 3 mL (3 mLs Intravenous Not Given 09/29/19 2145)  dexamethasone (DECADRON) injection 6 mg (has no administration in time range)  acetaminophen (TYLENOL) tablet 650 mg (has no administration in time range)     ED Discharge Orders    None       Note:  This document was prepared  using Dragon voice recognition software and may include unintentional dictation errors.   Vanessa [place], MD 09/29/19 2236

## 2019-09-29 NOTE — ED Triage Notes (Signed)
Pt dx with COVID last week (Thursday) and has since become more SOB with chest pain. Pt reporting the pain is worse when coughing and the SOB is wore upon exersion. Fevers since last week and nausea with vomiting yesterday. Pt reports feeling dizzy at this time.   PT reporting his daughter is a Marine scientist and has been checking Is oxygenation at home. Saturations on room air have been mid 51s. Pt is 87%on RA in triage.

## 2019-09-30 ENCOUNTER — Encounter (HOSPITAL_COMMUNITY): Payer: Self-pay | Admitting: Family Medicine

## 2019-09-30 ENCOUNTER — Inpatient Hospital Stay (HOSPITAL_COMMUNITY)
Admission: AD | Admit: 2019-09-30 | Discharge: 2019-10-03 | DRG: 177 | Disposition: A | Discharge status: Home / Self Care | Payer: BC Managed Care – PPO | Attending: Internal Medicine | Admitting: Internal Medicine

## 2019-09-30 DIAGNOSIS — Z79890 Hormone replacement therapy (postmenopausal): Secondary | ICD-10-CM

## 2019-09-30 DIAGNOSIS — J9601 Acute respiratory failure with hypoxia: Secondary | ICD-10-CM | POA: Diagnosis present

## 2019-09-30 DIAGNOSIS — Z881 Allergy status to other antibiotic agents status: Secondary | ICD-10-CM

## 2019-09-30 DIAGNOSIS — U071 COVID-19: Secondary | ICD-10-CM | POA: Diagnosis present

## 2019-09-30 DIAGNOSIS — R079 Chest pain, unspecified: Secondary | ICD-10-CM | POA: Diagnosis not present

## 2019-09-30 DIAGNOSIS — Z888 Allergy status to other drugs, medicaments and biological substances status: Secondary | ICD-10-CM

## 2019-09-30 DIAGNOSIS — I10 Essential (primary) hypertension: Secondary | ICD-10-CM | POA: Diagnosis present

## 2019-09-30 DIAGNOSIS — K219 Gastro-esophageal reflux disease without esophagitis: Secondary | ICD-10-CM | POA: Diagnosis present

## 2019-09-30 DIAGNOSIS — R0789 Other chest pain: Secondary | ICD-10-CM | POA: Diagnosis present

## 2019-09-30 DIAGNOSIS — E039 Hypothyroidism, unspecified: Secondary | ICD-10-CM | POA: Diagnosis present

## 2019-09-30 DIAGNOSIS — J1282 Pneumonia due to coronavirus disease 2019: Secondary | ICD-10-CM | POA: Diagnosis present

## 2019-09-30 DIAGNOSIS — Z79899 Other long term (current) drug therapy: Secondary | ICD-10-CM | POA: Diagnosis not present

## 2019-09-30 LAB — COMPREHENSIVE METABOLIC PANEL
ALT: 38 U/L (ref 0–44)
AST: 36 U/L (ref 15–41)
Albumin: 3 g/dL — ABNORMAL LOW (ref 3.5–5.0)
Alkaline Phosphatase: 62 U/L (ref 38–126)
Anion gap: 11 (ref 5–15)
BUN: 20 mg/dL (ref 8–23)
CO2: 23 mmol/L (ref 22–32)
Calcium: 8.5 mg/dL — ABNORMAL LOW (ref 8.9–10.3)
Chloride: 98 mmol/L (ref 98–111)
Creatinine, Ser: 0.93 mg/dL (ref 0.61–1.24)
GFR calc Af Amer: >60 mL/min (ref >60)
GFR calc non Af Amer: >60 mL/min (ref >60)
Glucose, Bld: 269 mg/dL — ABNORMAL HIGH (ref 70–99)
Potassium: 4.2 mmol/L (ref 3.5–5.1)
Sodium: 132 mmol/L — ABNORMAL LOW (ref 135–145)
Total Bilirubin: 0.8 mg/dL (ref 0.3–1.2)
Total Protein: 7.1 g/dL (ref 6.5–8.1)

## 2019-09-30 LAB — CBC WITH DIFFERENTIAL/PLATELET
Abs Immature Granulocytes: 0.02 10*3/uL (ref 0.00–0.07)
Basophils Absolute: 0 10*3/uL (ref 0.0–0.1)
Basophils Relative: 0 %
Eosinophils Absolute: 0 10*3/uL (ref 0.0–0.5)
Eosinophils Relative: 0 %
HCT: 44.9 % (ref 39.0–52.0)
Hemoglobin: 15.2 g/dL (ref 13.0–17.0)
Immature Granulocytes: 0 %
Lymphocytes Relative: 11 %
Lymphs Abs: 0.8 10*3/uL (ref 0.7–4.0)
MCH: 30.1 pg (ref 26.0–34.0)
MCHC: 33.9 g/dL (ref 30.0–36.0)
MCV: 88.9 fL (ref 80.0–100.0)
Monocytes Absolute: 0.2 10*3/uL (ref 0.1–1.0)
Monocytes Relative: 3 %
Neutro Abs: 6.4 10*3/uL (ref 1.7–7.7)
Neutrophils Relative %: 86 %
Platelets: 318 10*3/uL (ref 150–400)
RBC: 5.05 MIL/uL (ref 4.22–5.81)
RDW: 12.4 % (ref 11.5–15.5)
WBC: 7.4 10*3/uL (ref 4.0–10.5)
nRBC: 0 % (ref 0.0–0.2)

## 2019-09-30 LAB — RESPIRATORY PANEL BY RT PCR (FLU A&B, COVID)
Influenza A by PCR: NEGATIVE
Influenza B by PCR: NEGATIVE
SARS Coronavirus 2 by RT PCR: POSITIVE — AB

## 2019-09-30 LAB — FERRITIN: Ferritin: 392 ng/mL — ABNORMAL HIGH (ref 24–336)

## 2019-09-30 LAB — HIV ANTIBODY (ROUTINE TESTING W REFLEX): HIV Screen 4th Generation wRfx: NONREACTIVE

## 2019-09-30 LAB — D-DIMER, QUANTITATIVE: D-Dimer, Quant: 0.71 ug/mL-FEU — ABNORMAL HIGH (ref 0.00–0.50)

## 2019-09-30 LAB — TROPONIN I (HIGH SENSITIVITY): Troponin I (High Sensitivity): 4 ng/L (ref <18)

## 2019-09-30 LAB — C-REACTIVE PROTEIN
CRP: 23.1 mg/dL — ABNORMAL HIGH (ref <1.0)
CRP: 19 mg/dL — ABNORMAL HIGH (ref <1.0)

## 2019-09-30 LAB — MAGNESIUM: Magnesium: 1.9 mg/dL (ref 1.7–2.4)

## 2019-09-30 LAB — ABO/RH: ABO/RH(D): O POS

## 2019-09-30 LAB — HEPATITIS B SURFACE ANTIGEN: Hepatitis B Surface Ag: NONREACTIVE

## 2019-09-30 MED ORDER — SENNOSIDES-DOCUSATE SODIUM 8.6-50 MG PO TABS: 1.0000 | ORAL_TABLET | Freq: Every evening | ORAL | Status: DC | PRN

## 2019-09-30 MED ORDER — SODIUM CHLORIDE 0.9% FLUSH
3.0000 mL | Freq: Two times a day (BID) | INTRAVENOUS | Status: DC
  Administered 2019-09-30 – 2019-10-03 (×7): 3 mL via INTRAVENOUS

## 2019-09-30 MED ORDER — PHENOL 1.4 % MT LIQD
2.0000 | OROMUCOSAL | Status: DC | PRN
  Filled 2019-09-30: qty 177

## 2019-09-30 MED ORDER — SODIUM CHLORIDE 0.9% FLUSH
3.0000 mL | INTRAVENOUS | Status: DC | PRN
  Administered 2019-09-30: 09:00:00 3 mL via INTRAVENOUS

## 2019-09-30 MED ORDER — SODIUM CHLORIDE 0.9 % IV SOLN: 100.0000 mg | Freq: Every day | INTRAVENOUS | Status: DC

## 2019-09-30 MED ORDER — HYDROCODONE-ACETAMINOPHEN 5-325 MG PO TABS: 1.0000 | ORAL_TABLET | Freq: Four times a day (QID) | ORAL | Status: DC | PRN

## 2019-09-30 MED ORDER — GUAIFENESIN-DM 100-10 MG/5ML PO SYRP
10.0000 mL | ORAL_SOLUTION | ORAL | Status: DC | PRN
  Administered 2019-09-30 (×3): 10 mL via ORAL
  Filled 2019-09-30 (×4): qty 10

## 2019-09-30 MED ORDER — SODIUM CHLORIDE 0.9% IV SOLUTION: Freq: Once | INTRAVENOUS | Status: AC

## 2019-09-30 MED ORDER — ENOXAPARIN SODIUM 40 MG/0.4ML ~~LOC~~ SOLN
40.0000 mg | SUBCUTANEOUS | Status: DC
  Administered 2019-09-30 – 2019-10-03 (×4): 40 mg via SUBCUTANEOUS
  Filled 2019-09-30 (×4): qty 0.4

## 2019-09-30 MED ORDER — TOCILIZUMAB 400 MG/20ML IV SOLN
700.0000 mg | Freq: Once | INTRAVENOUS | Status: AC
  Administered 2019-09-30: 10:00:00 700 mg via INTRAVENOUS
  Filled 2019-09-30: qty 35

## 2019-09-30 MED ORDER — LABETALOL HCL 5 MG/ML IV SOLN: 10.0000 mg | INTRAVENOUS | Status: DC | PRN

## 2019-09-30 MED ORDER — SODIUM CHLORIDE 0.9 % IV SOLN
200.0000 mg | Freq: Once | INTRAVENOUS | Status: AC
  Administered 2019-09-30: 200 mg via INTRAVENOUS
  Filled 2019-09-30: qty 200

## 2019-09-30 MED ORDER — ACETAMINOPHEN 325 MG PO TABS: 650.0000 mg | ORAL_TABLET | Freq: Four times a day (QID) | ORAL | Status: DC | PRN

## 2019-09-30 MED ORDER — ONDANSETRON HCL 4 MG PO TABS
4.0000 mg | ORAL_TABLET | Freq: Four times a day (QID) | ORAL | Status: DC | PRN
  Administered 2019-09-30: 16:00:00 4 mg via ORAL
  Filled 2019-09-30: qty 1

## 2019-09-30 MED ORDER — SODIUM CHLORIDE 0.9 % IV SOLN: 250.0000 mL | INTRAVENOUS | Status: DC | PRN

## 2019-09-30 MED ORDER — SODIUM CHLORIDE 0.9 % IV SOLN
100.0000 mg | Freq: Every day | INTRAVENOUS | Status: DC
  Administered 2019-10-01 – 2019-10-03 (×3): 100 mg via INTRAVENOUS
  Filled 2019-09-30 (×3): qty 20

## 2019-09-30 MED ORDER — ONDANSETRON HCL 4 MG/2ML IJ SOLN: 4.0000 mg | Freq: Four times a day (QID) | INTRAMUSCULAR | Status: DC | PRN

## 2019-09-30 MED ORDER — SODIUM CHLORIDE 0.9% FLUSH
3.0000 mL | Freq: Two times a day (BID) | INTRAVENOUS | Status: DC
  Administered 2019-09-30 – 2019-10-03 (×3): 3 mL via INTRAVENOUS

## 2019-09-30 MED ORDER — DEXAMETHASONE SODIUM PHOSPHATE 10 MG/ML IJ SOLN
6.0000 mg | INTRAMUSCULAR | Status: DC
  Administered 2019-09-30 – 2019-10-02 (×3): 6 mg via INTRAVENOUS
  Filled 2019-09-30 (×3): qty 1

## 2019-09-30 MED ORDER — LEVOTHYROXINE SODIUM 75 MCG PO TABS
75.0000 ug | ORAL_TABLET | Freq: Every day | ORAL | Status: DC
  Administered 2019-10-01 – 2019-10-03 (×3): 75 ug via ORAL
  Filled 2019-09-30 (×3): qty 1

## 2019-09-30 NOTE — Progress Notes (Signed)
Remdesivir - Pharmacy Brief Note   O:  ALT: 38 CXR: evidence of infection SpO2: 97% on [place]   A/P:  Remdesivir 200 mg IVPB once followed by 100 mg IVPB daily x 4 days.   Hart Robinsons, PharmD Clinical Pharmacist   09/30/2019 1:05 AM

## 2019-09-30 NOTE — ED Notes (Signed)
[facility] here to transfer patient, EMTALA given to transport team

## 2019-09-30 NOTE — H&P (Signed)
History and Physical    Andrew Elliott [id number] DOB: [birth year] DOA: 09/30/2019  PCP: Glendon Axe, MD   Patient coming from: Home   Chief Complaint: SOB, fevers, chest discomfort   HPI: Andrew Elliott is a 59 y.o. male with medical history significant for hypertension, hypothyroidism, and GERD, now presenting to the emergency department for evaluation of shortness of breath, chest discomfort, and fevers.  Patient reports that he tested positive for COVID-19 at work on 09/18/2019, began to experience some symptoms the following day, developed fevers around 09/21/2019, and was started on prednisone, doxycycline, albuterol, and Tussionex as an outpatient shortly after that.  He began to develop some chest discomfort and shortness of breath and was seen in the emergency department on 09/27/2019 where he had CTA chest that was negative for PE and was stable for discharge home at that time.  He has since experienced progression in his shortness of breath, persistent fevers, and returns to the ED for evaluation.  He also continues to have chest pain, mainly across the front of his chest when he coughs. He takes Synthroid but not sure of dose. He is not taking any blood pressure medications at home.  [facility] ED Course: Upon arrival to the ED, patient is found to be febrile to 38.3 C, saturating 87% on room air while at rest, tachypneic, and with stable blood pressure.  Chest x-ray was concerning for bilateral interstitial groundglass opacities suspicious for a viral pneumonia.  EKG features a sinus rhythm.  Chemistry panel with sodium 134.  CBC unremarkable and lactic acid reassuringly normal.  Procalcitonin was 0.12.  Covid PCR was positive.  High-sensitivity troponin was normal x2 and BNP was also normal.  Blood cultures were collected in the emergency department and the patient was treated with Decadron, remdesivir, and supplemental oxygen.  He was transferred to [facility] for ongoing evaluation  and management.  Review of Systems:  All other systems reviewed and apart from HPI, are negative.  Past Medical History:  Diagnosis Date  . GERD (gastroesophageal reflux disease)   . Hypertension   . Hypothyroid 2013    Past Surgical History:  Procedure Laterality Date  . COLONOSCOPY WITH PROPOFOL N/A 10/19/2016   Procedure: COLONOSCOPY WITH PROPOFOL;  Surgeon: Lollie Sails, MD;  Location: [facility] ENDOSCOPY;  Service: Endoscopy;  Laterality: N/A;  . DISTAL BICEPS TENDON REPAIR Left 01/23/2018   Procedure: PRIMARY DISTAL BICEPS TENDON REPAIR TENDON RUPTURE;  Surgeon: Corky Mull, MD;  Location: [place];  Service: Orthopedics;  Laterality: Left;  C ARM OR FLUOROSCAN UNIT BIOMET TOGGLE LOK DEVICE  . ESOPHAGOGASTRODUODENOSCOPY (EGD) WITH PROPOFOL N/A 10/19/2016   Procedure: ESOPHAGOGASTRODUODENOSCOPY (EGD) WITH PROPOFOL;  Surgeon: Lollie Sails, MD;  Location: [facility] ENDOSCOPY;  Service: Endoscopy;  Laterality: N/A;     reports that he has never smoked. He has never used smokeless tobacco. He reports that he does not drink alcohol or use drugs.  Allergies  Allergen Reactions  . Nsaids Other (See Comments)    Advised not to take NSAIDS due to stomach problems  . Amoxicillin Rash    History reviewed. No pertinent family history.   Prior to Admission medications   Medication Sig Start Date End Date Taking? Authorizing Provider  acetaminophen (TYLENOL) 500 MG tablet Take 500 mg by mouth every 6 (six) hours as needed.    [provider]  diclofenac (VOLTAREN) 75 MG EC tablet Take 1 tablet (75 mg total) by mouth 2 (two) times daily.  11/27/18   Wallene Huh, DPM  dicyclomine (BENTYL) 10 MG capsule TAKE 1 CAPSULE BY MOUTH FOUR TIMES DAILY AS NEEDED 08/26/18   [provider]  gabapentin (NEURONTIN) 100 MG capsule  05/15/18   [provider]  ibuprofen (ADVIL,MOTRIN) 600 MG tablet Take 1 tablet (600 mg total) by mouth every 8 (eight) hours as  needed. 12/29/17   Sable Feil, PA-C  Multiple Vitamins-Minerals (CENTRUM SILVER) tablet Take 1 tablet by mouth daily.    [provider]  ondansetron (ZOFRAN ODT) 4 MG disintegrating tablet Take 1 tablet (4 mg total) by mouth every 8 (eight) hours as needed for nausea or vomiting. 09/27/19   Vanessa Puhi, MD  pantoprazole (PROTONIX) 40 MG tablet Take 40 mg by mouth 2 (two) times daily.    [provider]  traMADol Veatrice Bourbon) 50 MG tablet  09/06/18   [provider]    Physical Exam: There were no vitals filed for this visit.   Constitutional: NAD, calm  Eyes: PERTLA, lids and conjunctivae normal ENMT: Mucous membranes are moist. Posterior pharynx clear of any exudate or lesions.   Neck: normal, supple, no masses, no thyromegaly Respiratory:  no wheezing, no crackles. No accessory muscle use.  Cardiovascular: S1 & S2 heard, regular rate and rhythm. No extremity edema.   Abdomen: No distension, no tenderness, soft. Bowel sounds active.  Musculoskeletal: no clubbing / cyanosis. No joint deformity upper and lower extremities.   Skin: no significant rashes, lesions, ulcers. Warm, dry, well-perfused. Neurologic: No facial asymmetry. Sensation intact. Moving all extremities.   Psychiatric: Alert and oriented, appropriate throughout interview and exam. Very pleasant and cooperative.    Labs and Imaging on Admission: I have personally reviewed following labs and imaging studies  CBC: Recent Labs  Lab 09/27/19 1049 09/29/19 2132  WBC 14.1* 10.2  NEUTROABS 12.9*  --   HGB 15.6 15.6  HCT 45.5 45.4  MCV 88.3 87.3  PLT 266 [id number]   Basic Metabolic Panel: Recent Labs  Lab 09/27/19 1049 09/29/19 2132  NA 134* 134*  K 3.8 3.7  CL 97* 99  CO2 25 26  GLUCOSE 155* 116*  BUN 20 19  CREATININE 0.94 0.90  CALCIUM 9.0 8.5*  MG 1.7  --    GFR: Estimated Creatinine Clearance: 92.5 mL/min (by C-G formula based on SCr of 0.9 mg/dL). Liver Function Tests: Recent  Labs  Lab 09/27/19 1049  AST 38  ALT 38  ALKPHOS 59  BILITOT 0.7  PROT 7.3  ALBUMIN 3.5   No results for input(s): LIPASE, AMYLASE in the last 168 hours. No results for input(s): AMMONIA in the last 168 hours. Coagulation Profile: No results for input(s): INR, PROTIME in the last 168 hours. Cardiac Enzymes: No results for input(s): CKTOTAL, CKMB, CKMBINDEX, TROPONINI in the last 168 hours. BNP (last 3 results) No results for input(s): PROBNP in the last 8760 hours. HbA1C: No results for input(s): HGBA1C in the last 72 hours. CBG: No results for input(s): GLUCAP in the last 168 hours. Lipid Profile: Recent Labs    09/29/19 2153  TRIG 97   Thyroid Function Tests: No results for input(s): TSH, T4TOTAL, FREET4, T3FREE, THYROIDAB in the last 72 hours. Anemia Panel: Recent Labs    09/29/19 2153  FERRITIN 394*   Urine analysis: No results found for: COLORURINE, APPEARANCEUR, LABSPEC, PHURINE, GLUCOSEU, HGBUR, BILIRUBINUR, KETONESUR, PROTEINUR, UROBILINOGEN, NITRITE, LEUKOCYTESUR Sepsis Labs: @LABRCNTIP (procalcitonin:4,lacticidven:4) ) Recent Results (from the past 240 hour(s))  Respiratory Panel by RT PCR (  Flu A&B, Covid) - Nasopharyngeal Swab     Status: Abnormal   Collection Time: 09/30/19 12:18 AM   Specimen: Nasopharyngeal Swab  Result Value Ref Range Status   SARS Coronavirus 2 by RT PCR POSITIVE (A) NEGATIVE Final    Comment: RESULT CALLED TO, READ BACK BY AND VERIFIED WITH: KATE Wende Mott RN 0140 09/30/19 HNM (NOTE) SARS-CoV-2 target nucleic acids are DETECTED. SARS-CoV-2 RNA is generally detectable in upper respiratory specimens  during the acute phase of infection. Positive results are indicative of the presence of the identified virus, but do not rule out bacterial infection or co-infection with other pathogens not detected by the test. Clinical correlation with patient history and other diagnostic information is necessary to determine patient infection  status. The expected result is Negative. Fact Sheet for Patients:  PinkCheek.be Fact Sheet for Healthcare Providers: GravelBags.it This test is not yet approved or cleared by the [place] FDA and  has been authorized for detection and/or diagnosis of SARS-CoV-2 by FDA under an Emergency Use Authorization (EUA).  This EUA will remain in effect (meaning this test can be used) for  the duration of  the COVID-19 declaration under Section 564(b)(1) of the Act, 21 U.S.C. section 360bbb-3(b)(1), unless the authorization is terminated or revoked sooner.    Influenza A by PCR NEGATIVE NEGATIVE Final   Influenza B by PCR NEGATIVE NEGATIVE Final    Comment: (NOTE) The Xpert Xpress SARS-CoV-2/FLU/RSV assay is intended as an aid in  the diagnosis of influenza from Nasopharyngeal swab specimens and  should not be used as a sole basis for treatment. Nasal washings and  aspirates are unacceptable for Xpert Xpress SARS-CoV-2/FLU/RSV  testing. Fact Sheet for Patients: PinkCheek.be Fact Sheet for Healthcare Providers: GravelBags.it This test is not yet approved or cleared by the [place] FDA and  has been authorized for detection and/or diagnosis of SARS-CoV-2 by  FDA under an Emergency Use Authorization (EUA). This EUA will remain  in effect (meaning this test can be used) for the duration of the  Covid-19 declaration under Section 564(b)(1) of the Act, 21  U.S.C. section 360bbb-3(b)(1), unless the authorization is  terminated or revoked. Performed at [facility], [address] [place], [place] [address]      Radiological Exams on Admission: DG Chest Portable 1 View  Result Date: 09/29/2019 CLINICAL DATA:  Shortness of breath with chest pain EXAM: PORTABLE CHEST 1 VIEW COMPARISON:  09/27/2019, 06/07/2015 FINDINGS: Streaky interstitial and patchy ground-glass  opacities consistent with bilateral pneumonia. No pleural effusion. Low lung volumes. Cardiomediastinal silhouette within normal limits. No pneumothorax. IMPRESSION: Low lung volumes. Radiographically stable bilateral interstitial and ground-glass opacities, felt to represent viral pneumonia. Electronically Signed   By: Donavan Foil M.D.   On: 09/29/2019 21:59    EKG: Independently reviewed. Sinus rhythm, rate 91, QTc 418 ms.   Assessment/Plan   1. COVID-19 pneumonia with acute hypoxic respiratory failure  - Presents with fevers, cough, and progressive SOB despite outpatient treatment with doxycycline, prednisone, albuterol, and Tussionex, is confirmed to have COVID with CXR findings consistent with viral PNA, low procalcitonin, and hypoxia requiring 2 Lpm of supplemental O2 while at rest  - He was started on remdesivir and Decadron in ED  - Continue Decadron, remdesivir, and supplemental O2, trend markers and clinical course    2. Chest pain  - CTA was negative for PE, no acute ischemic features noted on EKG, and HS troponin was normal x2  - Likely secondary to #1  3. Hypothyroidism  - Patient unsure of his Synthroid dose, will see if pharmacy can help    DVT prophylaxis: Lovenox  Code Status: Full  Family Communication: Discussed with patient  Disposition Plan: Will likely return home once respiratory status improved and stable.  Consults called: None  Admission status: Inpatient     Vianne Bulls, MD [facility] Pager: See [email or web address]  If 7AM-7PM, please contact the daytime attending [email or web address]  09/30/2019, 6:12 AM

## 2019-09-30 NOTE — Progress Notes (Signed)
[facility] PROGRESS NOTE    Progress Note  Andrew Elliott  [id number] DOB: May 15, 1961 DOA: 09/30/2019 PCP: Glendon Axe, MD     Brief Narrative:   Andrew Elliott is an 59 y.o. male past medical history of hypertension hypothyroidism presents to the ED for shortness of breath, he tested positive for COVID-19 at work on 09/18/2019 has been experiencing symptoms of fever and mild shortness of breath he was started on prednisone and doxycycline as an outpatient, he began having some chest discomfort he returned to the ED on 09/27/2019 where a CTA of the chest was negative for PE he was not hypoxic and discharged home.  Then he relates his shortness of breath has progressed significantly at [facility] he was found to be satting 87% while at rest tachypneic and tachycardic chest x-ray showed bilateral infiltrates, his procalcitonin was 0.2 it was repeated Covid PCR was positive he was started on IV Decadron and remdesivir and transferred to [facility] on 09/30/2019.  Assessment/Plan:   Acute failure with hypoxia due to Acute hypoxemic respiratory failure due to COVID-19 [facility]): He presented with fever cough and progressive shortness of breath was found to be hypoxic lab and imaging compatible with COVID-19 pneumonia. He was started on IV remdesivir and steroids.  This probably all inflammation, unlikely infection as his procalcitonin is low and he has no leukocytosis, his CRP is significantly elevated. He agreed to IV Actemra and convalescent plasma.. The treatment plan and use of medications (Actemra and convalescent plasma) and known side effects were discussed with patient/family, they were clearly explained that there is no proven definitive treatment for COVID-19 infection, any medications used here are based on published clinical articles/anecdotal data which are not peer-reviewed or randomized control trials.  Complete risks and long-term side effects are unknown, however in the  best clinical judgment they seem to be of some clinical benefit rather than medical risks.  Patient/family agree with the treatment plan and want to receive the given medications. We will follow strict I's and O's and daily weights, try to keep the patient prone for at least 16 hours a day if not prone out of bed to chair, continue incentive spirometry and flutter valve.  Chest pain: CTA negative for PE EKG showed no signs of ischemia, cardiac biomarkers were negative, likely secondary to #1.  Hypothyroidism: Continue Synthroid.   DVT prophylaxis: lovenox Family Communication:none Disposition Plan/Barrier to D/C: Once he is off oxygen and completed his IV remdesivir treatment.  Code Status:     Code Status Orders  (From admission, onward)         Start     Ordered   09/30/19 0610  Full code  Continuous     09/30/19 0611        Code Status History    Date Active Date Inactive Code Status Order ID Comments User Context   01/23/2018 1346 01/23/2018 1815 Full Code [id number]  Poggi, Marshall Cork, MD Inpatient   Advance Care Planning Activity        IV Access:    Peripheral IV   Procedures and diagnostic studies:   DG Chest Portable 1 View  Result Date: 09/29/2019 CLINICAL DATA:  Shortness of breath with chest pain EXAM: PORTABLE CHEST 1 VIEW COMPARISON:  09/27/2019, 06/07/2015 FINDINGS: Streaky interstitial and patchy ground-glass opacities consistent with bilateral pneumonia. No pleural effusion. Low lung volumes. Cardiomediastinal silhouette within normal limits. No pneumothorax. IMPRESSION: Low lung volumes. Radiographically stable bilateral interstitial and ground-glass opacities, felt  to represent viral pneumonia. Electronically Signed   By: Donavan Foil M.D.   On: 09/29/2019 21:59     Medical Consultants:    None.  Anti-Infectives:   IV remdesivir  Subjective:    [name] he relates his breathing is worse than yesterday.  Objective:    Vitals:    09/30/19 0605  BP: 122/81  Pulse: (!) 56  Resp: (!) 26  Temp: 97.8 F (36.6 C)  TempSrc: Oral  SpO2: 98%  Weight: 89.5 kg  Height: 5\' 7"  (1.702 m)   SpO2: 98 % O2 Flow Rate (L/min): 2 L/min  No intake or output data in the 24 hours ending 09/30/19 0714 Filed Weights   09/30/19 0605  Weight: 89.5 kg    Exam: General exam: In no acute distress. Respiratory system: Good air movement and crackles bilaterally. Cardiovascular system: S1 & S2 heard, RRR. No JVD. Gastrointestinal system: Abdomen is nondistended, soft and nontender.  Central nervous system: Alert and oriented. No focal neurological deficits. Extremities: No pedal edema. Skin: No rashes, lesions or ulcers  Data Reviewed:    Labs: Basic Metabolic Panel: Recent Labs  Lab 09/27/19 1049 09/29/19 2132  NA 134* 134*  K 3.8 3.7  CL 97* 99  CO2 25 26  GLUCOSE 155* 116*  BUN 20 19  CREATININE 0.94 0.90  CALCIUM 9.0 8.5*  MG 1.7  --    GFR Estimated Creatinine Clearance: 92 mL/min (by C-G formula based on SCr of 0.9 mg/dL). Liver Function Tests: Recent Labs  Lab 09/27/19 1049  AST 38  ALT 38  ALKPHOS 59  BILITOT 0.7  PROT 7.3  ALBUMIN 3.5   No results for input(s): LIPASE, AMYLASE in the last 168 hours. No results for input(s): AMMONIA in the last 168 hours. Coagulation profile No results for input(s): INR, PROTIME in the last 168 hours. COVID-19 Labs  Recent Labs    09/29/19 2153 09/29/19 2154  FERRITIN 394*  --   LDH 303*  --   CRP  --  23.1*    Lab Results  Component Value Date   SARSCOV2NAA POSITIVE (A) 09/30/2019    CBC: Recent Labs  Lab 09/27/19 1049 09/29/19 2132  WBC 14.1* 10.2  NEUTROABS 12.9*  --   HGB 15.6 15.6  HCT 45.5 45.4  MCV 88.3 87.3  PLT 266 315   Cardiac Enzymes: No results for input(s): CKTOTAL, CKMB, CKMBINDEX, TROPONINI in the last 168 hours. BNP (last 3 results) No results for input(s): PROBNP in the last 8760 hours. CBG: No results for input(s):  GLUCAP in the last 168 hours. D-Dimer: No results for input(s): DDIMER in the last 72 hours. Hgb A1c: No results for input(s): HGBA1C in the last 72 hours. Lipid Profile: Recent Labs    09/29/19 2153  TRIG 97   Thyroid function studies: No results for input(s): TSH, T4TOTAL, T3FREE, THYROIDAB in the last 72 hours.  Invalid input(s): FREET3 Anemia work up: Recent Labs    09/29/19 2153  FERRITIN 394*   Sepsis Labs: Recent Labs  Lab 09/27/19 1049 09/29/19 2132 09/29/19 2152 09/29/19 2153  PROCALCITON <0.10  --   --  0.12  WBC 14.1* 10.2  --   --   LATICACIDVEN  --   --  1.3  --    Microbiology Recent Results (from the past 240 hour(s))  Respiratory Panel by RT PCR (Flu A&B, Covid) - Nasopharyngeal Swab     Status: Abnormal   Collection Time: 09/30/19 12:18 AM  Specimen: Nasopharyngeal Swab  Result Value Ref Range Status   SARS Coronavirus 2 by RT PCR POSITIVE (A) NEGATIVE Final    Comment: RESULT CALLED TO, READ BACK BY AND VERIFIED WITH: KATE Wende Mott RN 0140 09/30/19 HNM (NOTE) SARS-CoV-2 target nucleic acids are DETECTED. SARS-CoV-2 RNA is generally detectable in upper respiratory specimens  during the acute phase of infection. Positive results are indicative of the presence of the identified virus, but do not rule out bacterial infection or co-infection with other pathogens not detected by the test. Clinical correlation with patient history and other diagnostic information is necessary to determine patient infection status. The expected result is Negative. Fact Sheet for Patients:  PinkCheek.be Fact Sheet for Healthcare Providers: GravelBags.it This test is not yet approved or cleared by the [place] FDA and  has been authorized for detection and/or diagnosis of SARS-CoV-2 by FDA under an Emergency Use Authorization (EUA).  This EUA will remain in effect (meaning this test can be used) for  the  duration of  the COVID-19 declaration under Section 564(b)(1) of the Act, 21 U.S.C. section 360bbb-3(b)(1), unless the authorization is terminated or revoked sooner.    Influenza A by PCR NEGATIVE NEGATIVE Final   Influenza B by PCR NEGATIVE NEGATIVE Final    Comment: (NOTE) The Xpert Xpress SARS-CoV-2/FLU/RSV assay is intended as an aid in  the diagnosis of influenza from Nasopharyngeal swab specimens and  should not be used as a sole basis for treatment. Nasal washings and  aspirates are unacceptable for Xpert Xpress SARS-CoV-2/FLU/RSV  testing. Fact Sheet for Patients: PinkCheek.be Fact Sheet for Healthcare Providers: GravelBags.it This test is not yet approved or cleared by the [place] FDA and  has been authorized for detection and/or diagnosis of SARS-CoV-2 by  FDA under an Emergency Use Authorization (EUA). This EUA will remain  in effect (meaning this test can be used) for the duration of the  Covid-19 declaration under Section 564(b)(1) of the Act, 21  U.S.C. section 360bbb-3(b)(1), unless the authorization is  terminated or revoked. Performed at [facility], [address] [place], [place] [address]      Medications:   . dexamethasone (DECADRON) injection  6 mg Intravenous Q24H  . enoxaparin (LOVENOX) injection  40 mg Subcutaneous Q24H  . sodium chloride flush  3 mL Intravenous Q12H  . sodium chloride flush  3 mL Intravenous Q12H   Continuous Infusions: . sodium chloride    . [START ON 10/01/2019] remdesivir 100 mg in NS 100 mL        LOS: 0 days   Charlynne Cousins  [facility]  09/30/2019, 7:14 AM

## 2019-09-30 NOTE — ED Notes (Signed)
Patient sleeping at this time.

## 2019-09-30 NOTE — Plan of Care (Signed)
Patient alert, oriented, ambulated to the restroom, 2L [place], tolerated, no concerns stated  Problem: Education: Goal: Knowledge of risk factors and measures for prevention of condition will improve 09/30/2019 2200 by Markus Daft, RN Outcome: Progressing 09/30/2019 2159 by Markus Daft, RN Outcome: Progressing   Problem: Coping: Goal: Psychosocial and spiritual needs will be supported 09/30/2019 2200 by Markus Daft, RN Outcome: Progressing 09/30/2019 2159 by Markus Daft, RN Outcome: Progressing   Problem: Respiratory: Goal: Will maintain a patent airway 09/30/2019 2200 by Markus Daft, RN Outcome: Progressing 09/30/2019 2159 by Markus Daft, RN Outcome: Progressing Goal: Complications related to the disease process, condition or treatment will be avoided or minimized 09/30/2019 2200 by Markus Daft, RN Outcome: Progressing 09/30/2019 2159 by Markus Daft, RN Outcome: Progressing

## 2019-09-30 NOTE — ED Notes (Signed)
Spoke with daughter [name], updated on plan of care and plan to transfer to [facility] in [place]

## 2019-09-30 NOTE — ED Notes (Signed)
Patient denies needing meal at this time, says he's not hungry. Will follow up shortly

## 2019-09-30 NOTE — ED Notes (Signed)
Patient denies any needs at this time.

## 2019-09-30 NOTE — ED Notes (Signed)
Awaiting transport to [facility]

## 2019-10-01 LAB — BPAM FFP
Blood Product Expiration Date: 202102171326
Blood Product Expiration Date: 202102171335
ISSUE DATE / TIME: 202102161353
ISSUE DATE / TIME: 202102161353
Unit Type and Rh: 5100
Unit Type and Rh: 5100

## 2019-10-01 LAB — CBC WITH DIFFERENTIAL/PLATELET
Abs Immature Granulocytes: 0.03 10*3/uL (ref 0.00–0.07)
Basophils Absolute: 0 10*3/uL (ref 0.0–0.1)
Basophils Relative: 0 %
Eosinophils Absolute: 0 10*3/uL (ref 0.0–0.5)
Eosinophils Relative: 0 %
HCT: 43.2 % (ref 39.0–52.0)
Hemoglobin: 14.7 g/dL (ref 13.0–17.0)
Immature Granulocytes: 0 %
Lymphocytes Relative: 9 %
Lymphs Abs: 0.8 10*3/uL (ref 0.7–4.0)
MCH: 30.1 pg (ref 26.0–34.0)
MCHC: 34 g/dL (ref 30.0–36.0)
MCV: 88.5 fL (ref 80.0–100.0)
Monocytes Absolute: 0.3 10*3/uL (ref 0.1–1.0)
Monocytes Relative: 3 %
Neutro Abs: 8 10*3/uL — ABNORMAL HIGH (ref 1.7–7.7)
Neutrophils Relative %: 88 %
Platelets: 385 10*3/uL (ref 150–400)
RBC: 4.88 MIL/uL (ref 4.22–5.81)
RDW: 12 % (ref 11.5–15.5)
WBC: 9.1 10*3/uL (ref 4.0–10.5)
nRBC: 0 % (ref 0.0–0.2)

## 2019-10-01 LAB — PREPARE FRESH FROZEN PLASMA

## 2019-10-01 LAB — COMPREHENSIVE METABOLIC PANEL
ALT: 54 U/L — ABNORMAL HIGH (ref 0–44)
AST: 42 U/L — ABNORMAL HIGH (ref 15–41)
Albumin: 3.2 g/dL — ABNORMAL LOW (ref 3.5–5.0)
Alkaline Phosphatase: 64 U/L (ref 38–126)
Anion gap: 10 (ref 5–15)
BUN: 22 mg/dL (ref 8–23)
CO2: 25 mmol/L (ref 22–32)
Calcium: 8.9 mg/dL (ref 8.9–10.3)
Chloride: 100 mmol/L (ref 98–111)
Creatinine, Ser: 0.84 mg/dL (ref 0.61–1.24)
GFR calc Af Amer: >60 mL/min (ref >60)
GFR calc non Af Amer: >60 mL/min (ref >60)
Glucose, Bld: 178 mg/dL — ABNORMAL HIGH (ref 70–99)
Potassium: 4.3 mmol/L (ref 3.5–5.1)
Sodium: 135 mmol/L (ref 135–145)
Total Bilirubin: 0.5 mg/dL (ref 0.3–1.2)
Total Protein: 7.4 g/dL (ref 6.5–8.1)

## 2019-10-01 LAB — C-REACTIVE PROTEIN: CRP: 11.6 mg/dL — ABNORMAL HIGH (ref <1.0)

## 2019-10-01 LAB — MAGNESIUM: Magnesium: 2.1 mg/dL (ref 1.7–2.4)

## 2019-10-01 LAB — D-DIMER, QUANTITATIVE: D-Dimer, Quant: 0.61 ug/mL-FEU — ABNORMAL HIGH (ref 0.00–0.50)

## 2019-10-01 LAB — FERRITIN: Ferritin: 364 ng/mL — ABNORMAL HIGH (ref 24–336)

## 2019-10-01 MED ORDER — ZINC SULFATE 220 (50 ZN) MG PO CAPS
220.0000 mg | ORAL_CAPSULE | Freq: Every day | ORAL | Status: DC
  Administered 2019-10-01 – 2019-10-03 (×3): 220 mg via ORAL
  Filled 2019-10-01 (×3): qty 1

## 2019-10-01 MED ORDER — ASCORBIC ACID 500 MG PO TABS
500.0000 mg | ORAL_TABLET | Freq: Every day | ORAL | Status: DC
  Administered 2019-10-01 – 2019-10-03 (×3): 500 mg via ORAL
  Filled 2019-10-01 (×3): qty 1

## 2019-10-01 MED ORDER — FUROSEMIDE 10 MG/ML IJ SOLN
40.0000 mg | Freq: Once | INTRAMUSCULAR | Status: AC
  Administered 2019-10-01: 09:00:00 40 mg via INTRAVENOUS
  Filled 2019-10-01: qty 4

## 2019-10-01 MED ORDER — ALUM & MAG HYDROXIDE-SIMETH 200-200-20 MG/5ML PO SUSP
15.0000 mL | ORAL | Status: DC | PRN
  Administered 2019-10-01 – 2019-10-02 (×3): 15 mL via ORAL
  Filled 2019-10-01 (×3): qty 30

## 2019-10-01 MED ORDER — IPRATROPIUM-ALBUTEROL 20-100 MCG/ACT IN AERS
1.0000 | INHALATION_SPRAY | Freq: Four times a day (QID) | RESPIRATORY_TRACT | Status: DC
  Administered 2019-10-01 – 2019-10-03 (×9): 1 via RESPIRATORY_TRACT
  Filled 2019-10-01: qty 4

## 2019-10-01 NOTE — Progress Notes (Signed)
Patient resting in bed, vitals observed and followed up with  Dr Bridgett Larsson, he is aware of recent runs A-tach and SVT

## 2019-10-01 NOTE — Progress Notes (Signed)
PROGRESS NOTE    Andrew Elliott  [id number] DOB: 26-Jun-1961 DOA: 09/30/2019 PCP: Glendon Axe, MD   Brief Narrative:  59 year old with history of HTN, GERD, hypothyroidism admitted to the hospital for shortness of breath diagnosed with COVID-19 pneumonia on 2/4.  Outpatient was placed on prednisone, doxycycline, albuterol and Tussionex without any improvement of his symptoms.  In the ER CTA chest was negative for PE initially on 2/13 and was discharged home but he returned back again to the ER due to persistence of his symptoms.  This time he was noted to be hypoxic saturating 87% on room air therefore admitted to the hospital.  Procalcitonin was 0.12, BNP was normal.  Started on Decadron, remdesivir.   Assessment & Plan:   Principal Problem:   Acute hypoxemic respiratory failure due to COVID-19 [facility]) Active Problems:   Chest pain   Acute respiratory failure with hypoxia (HCC)  Acute hypoxic respiratory failure secondary to COVID-19 pneumonia -Oxygen levels- 2-4L [place] -Remdesivir- D2 -Decadrone- D2 -Actemra- 2/16 -Convalescent plasma- 2/16 -Antibiotics- None -procalcitonin- Neg -Lasix 40mg  IV once.  -CTA chest-bilateral opacities/infiltrates -Supportive care-antitussive, inhalers, I-S/flutter -CODE STATUS confirmed -Vitamin C & Zinc. Prone >16hrs/day.  -Routine: Labs have been reviewed including ferritin, LDH, CRP, d-dimer, fibrinogen.  Will need to trend this lab daily.  Atypical chest discomfort -Improved.  Likely noncardiac.  Troponins flat.  CTA negative for PE.  Essential hypertension -Not on home meds  Hypothyroidism -Daily Synthroid  DVT prophylaxis: Lovenox Code Status: Full Family Communication: Called his daughter Andrew Elliott, she did not answer therefore left a voicemail Disposition Plan: Failed outpatient treatment with doxycycline and steroids.  Patient From= home  Patient Anticipated D/C place= Home  Barriers= maintain hospital stay until his hypoxia has  improved.  Still requiring 2-4 L nasal cannula.  Not on any home oxygen.      Subjective: Feels little better this morning, still has some exertional dyspnea but improved compared to yesterday.  Review of Systems Otherwise negative except as per HPI, including: General: Denies fever, chills, night sweats or unintended weight loss. Resp: Denies hemoptysis Cardiac: Denies chest pain, palpitations, orthopnea, paroxysmal nocturnal dyspnea. GI: Denies abdominal pain, nausea, vomiting, diarrhea or constipation GU: Denies dysuria, frequency, hesitancy or incontinence MS: Denies muscle aches, joint pain or swelling Neuro: Denies headache, neurologic deficits (focal weakness, numbness, tingling), abnormal gait Psych: Denies anxiety, depression, SI/HI/AVH Skin: Denies new rashes or lesions ID: Denies sick contacts, exotic exposures, travel  Examination:  General exam: Appears calm and comfortable, 2 L nasal cannula Respiratory system: Bilateral rhonchi with bibasilar crackles Cardiovascular system: S1 & S2 heard, RRR. No JVD, murmurs, rubs, gallops or clicks. No pedal edema. Gastrointestinal system: Abdomen is nondistended, soft and nontender. No organomegaly or masses felt. Normal bowel sounds heard. Central nervous system: Alert and oriented. No focal neurological deficits. Extremities: Symmetric 5 x 5 power. Skin: No rashes, lesions or ulcers Psychiatry: Judgement and insight appear normal. Mood & affect appropriate.     Objective: Vitals:   10/01/19 0005 10/01/19 0448 10/01/19 0449 10/01/19 0723  BP: 122/88  119/87   Pulse: 66 60 68   Resp: (!) 26 (!) 21 (!) 28 18  Temp: 99.1 F (37.3 C)  98.5 F (36.9 C) 98.6 F (37 C)  TempSrc: Oral  Oral Oral  SpO2: 96% 94% 96%   Weight:      Height:        Intake/Output Summary (Last 24 hours) at 10/01/2019 0811 Last data filed at 09/30/2019 1800 Gross  per 24 hour  Intake 846 ml  Output --  Net 846 ml   Filed Weights   09/30/19  0605  Weight: 89.5 kg     Data Reviewed:   CBC: Recent Labs  Lab 09/27/19 1049 09/29/19 2132 09/30/19 0710 10/01/19 0152  WBC 14.1* 10.2 7.4 9.1  NEUTROABS 12.9*  --  6.4 8.0*  HGB 15.6 15.6 15.2 14.7  HCT 45.5 45.4 44.9 43.2  MCV 88.3 87.3 88.9 88.5  PLT 266 315 318 0000000   Basic Metabolic Panel: Recent Labs  Lab 09/27/19 1049 09/29/19 2132 09/30/19 0710 10/01/19 0152  NA 134* 134* 132* 135  K 3.8 3.7 4.2 4.3  CL 97* 99 98 100  CO2 25 26 23 25   GLUCOSE 155* 116* 269* 178*  BUN 20 19 20 22   CREATININE 0.94 0.90 0.93 0.84  CALCIUM 9.0 8.5* 8.5* 8.9  MG 1.7  --  1.9 2.1   GFR: Estimated Creatinine Clearance: 98.6 mL/min (by C-G formula based on SCr of 0.84 mg/dL). Liver Function Tests: Recent Labs  Lab 09/27/19 1049 09/30/19 0710 10/01/19 0152  AST 38 36 42*  ALT 38 38 54*  ALKPHOS 59 62 64  BILITOT 0.7 0.8 0.5  PROT 7.3 7.1 7.4  ALBUMIN 3.5 3.0* 3.2*   No results for input(s): LIPASE, AMYLASE in the last 168 hours. No results for input(s): AMMONIA in the last 168 hours. Coagulation Profile: No results for input(s): INR, PROTIME in the last 168 hours. Cardiac Enzymes: No results for input(s): CKTOTAL, CKMB, CKMBINDEX, TROPONINI in the last 168 hours. BNP (last 3 results) No results for input(s): PROBNP in the last 8760 hours. HbA1C: No results for input(s): HGBA1C in the last 72 hours. CBG: No results for input(s): GLUCAP in the last 168 hours. Lipid Profile: Recent Labs    09/29/19 2153  TRIG 97   Thyroid Function Tests: No results for input(s): TSH, T4TOTAL, FREET4, T3FREE, THYROIDAB in the last 72 hours. Anemia Panel: Recent Labs    09/30/19 0710 10/01/19 0152  FERRITIN 392* 364*   Sepsis Labs: Recent Labs  Lab 09/27/19 1049 09/29/19 2152 09/29/19 2153  PROCALCITON <0.10  --  0.12  LATICACIDVEN  --  1.3  --     Recent Results (from the past 240 hour(s))  Blood Culture (routine x 2)     Status: None (Preliminary result)    Collection Time: 09/29/19  9:53 PM   Specimen: BLOOD  Result Value Ref Range Status   Specimen Description BLOOD LEFT ASSIST CONTROL  Final   Special Requests   Final    BOTTLES DRAWN AEROBIC AND ANAEROBIC Blood Culture adequate volume   Culture   Final    NO GROWTH 2 DAYS Performed at [facility], [address], [place], [place] [address]    Report Status PENDING  Incomplete  Blood Culture (routine x 2)     Status: None (Preliminary result)   Collection Time: 09/29/19  9:54 PM   Specimen: BLOOD  Result Value Ref Range Status   Specimen Description BLOOD LEFT ARM  Final   Special Requests   Final    BOTTLES DRAWN AEROBIC AND ANAEROBIC Blood Culture results may not be optimal due to an inadequate volume of blood received in culture bottles   Culture   Final    NO GROWTH 2 DAYS Performed at [facility], [address] [place], [place] [address]    Report Status PENDING  Incomplete  Respiratory Panel by RT PCR (  Flu A&B, Covid) - Nasopharyngeal Swab     Status: Abnormal   Collection Time: 09/30/19 12:18 AM   Specimen: Nasopharyngeal Swab  Result Value Ref Range Status   SARS Coronavirus 2 by RT PCR POSITIVE (A) NEGATIVE Final    Comment: RESULT CALLED TO, READ BACK BY AND VERIFIED WITH: KATE Wende Mott RN 0140 09/30/19 HNM (NOTE) SARS-CoV-2 target nucleic acids are DETECTED. SARS-CoV-2 RNA is generally detectable in upper respiratory specimens  during the acute phase of infection. Positive results are indicative of the presence of the identified virus, but do not rule out bacterial infection or co-infection with other pathogens not detected by the test. Clinical correlation with patient history and other diagnostic information is necessary to determine patient infection status. The expected result is Negative. Fact Sheet for Patients:  PinkCheek.be Fact Sheet for Healthcare  Providers: GravelBags.it This test is not yet approved or cleared by the [place] FDA and  has been authorized for detection and/or diagnosis of SARS-CoV-2 by FDA under an Emergency Use Authorization (EUA).  This EUA will remain in effect (meaning this test can be used) for  the duration of  the COVID-19 declaration under Section 564(b)(1) of the Act, 21 U.S.C. section 360bbb-3(b)(1), unless the authorization is terminated or revoked sooner.    Influenza A by PCR NEGATIVE NEGATIVE Final   Influenza B by PCR NEGATIVE NEGATIVE Final    Comment: (NOTE) The Xpert Xpress SARS-CoV-2/FLU/RSV assay is intended as an aid in  the diagnosis of influenza from Nasopharyngeal swab specimens and  should not be used as a sole basis for treatment. Nasal washings and  aspirates are unacceptable for Xpert Xpress SARS-CoV-2/FLU/RSV  testing. Fact Sheet for Patients: PinkCheek.be Fact Sheet for Healthcare Providers: GravelBags.it This test is not yet approved or cleared by the [place] FDA and  has been authorized for detection and/or diagnosis of SARS-CoV-2 by  FDA under an Emergency Use Authorization (EUA). This EUA will remain  in effect (meaning this test can be used) for the duration of the  Covid-19 declaration under Section 564(b)(1) of the Act, 21  U.S.C. section 360bbb-3(b)(1), unless the authorization is  terminated or revoked. Performed at [facility], [address], [place], [place] [address]          Radiology Studies: DG Chest Portable 1 View  Result Date: 09/29/2019 CLINICAL DATA:  Shortness of breath with chest pain EXAM: PORTABLE CHEST 1 VIEW COMPARISON:  09/27/2019, 06/07/2015 FINDINGS: Streaky interstitial and patchy ground-glass opacities consistent with bilateral pneumonia. No pleural effusion. Low lung volumes. Cardiomediastinal silhouette within normal limits. No  pneumothorax. IMPRESSION: Low lung volumes. Radiographically stable bilateral interstitial and ground-glass opacities, felt to represent viral pneumonia. Electronically Signed   By: Donavan Foil M.D.   On: 09/29/2019 21:59        Scheduled Meds: . dexamethasone (DECADRON) injection  6 mg Intravenous Q24H  . enoxaparin (LOVENOX) injection  40 mg Subcutaneous Q24H  . levothyroxine  75 mcg Oral QAC breakfast  . sodium chloride flush  3 mL Intravenous Q12H  . sodium chloride flush  3 mL Intravenous Q12H   Continuous Infusions: . sodium chloride    . remdesivir 100 mg in NS 100 mL       LOS: 1 day   Time spent= 35 mins    Thressa Shiffer Arsenio Loader, MD [facility]  If 7PM-7AM, please contact night-coverage  10/01/2019, 8:11 AM

## 2019-10-01 NOTE — Progress Notes (Addendum)
Tele reported that patient had 12 beats of Atrial tach&SVT w/HR increased to 130s. V/S on assessment were: 122/88, 66,26, 96%, 99.1, SR & patient was asymptomatic. MD paged and assigned nurse notified.

## 2019-10-01 NOTE — Progress Notes (Signed)
0900- Updates given to patient's daughter. Time allowed for questions or concerns. Daughter requested to speak to MD. Message sent to MD. Doctor Chirag  Stated that he called but went it went to voicemail.

## 2019-10-02 LAB — COMPREHENSIVE METABOLIC PANEL
ALT: 68 U/L — ABNORMAL HIGH (ref 0–44)
AST: 47 U/L — ABNORMAL HIGH (ref 15–41)
Albumin: 3.1 g/dL — ABNORMAL LOW (ref 3.5–5.0)
Alkaline Phosphatase: 64 U/L (ref 38–126)
Anion gap: 13 (ref 5–15)
BUN: 29 mg/dL — ABNORMAL HIGH (ref 8–23)
CO2: 23 mmol/L (ref 22–32)
Calcium: 8.8 mg/dL — ABNORMAL LOW (ref 8.9–10.3)
Chloride: 96 mmol/L — ABNORMAL LOW (ref 98–111)
Creatinine, Ser: 0.92 mg/dL (ref 0.61–1.24)
GFR calc Af Amer: >60 mL/min (ref >60)
GFR calc non Af Amer: >60 mL/min (ref >60)
Glucose, Bld: 192 mg/dL — ABNORMAL HIGH (ref 70–99)
Potassium: 4.3 mmol/L (ref 3.5–5.1)
Sodium: 132 mmol/L — ABNORMAL LOW (ref 135–145)
Total Bilirubin: 0.8 mg/dL (ref 0.3–1.2)
Total Protein: 7.3 g/dL (ref 6.5–8.1)

## 2019-10-02 LAB — CBC WITH DIFFERENTIAL/PLATELET
Abs Immature Granulocytes: 0.03 10*3/uL (ref 0.00–0.07)
Basophils Absolute: 0 10*3/uL (ref 0.0–0.1)
Basophils Relative: 0 %
Eosinophils Absolute: 0 10*3/uL (ref 0.0–0.5)
Eosinophils Relative: 0 %
HCT: 44.6 % (ref 39.0–52.0)
Hemoglobin: 15.2 g/dL (ref 13.0–17.0)
Immature Granulocytes: 0 %
Lymphocytes Relative: 8 %
Lymphs Abs: 0.8 10*3/uL (ref 0.7–4.0)
MCH: 30.6 pg (ref 26.0–34.0)
MCHC: 34.1 g/dL (ref 30.0–36.0)
MCV: 89.7 fL (ref 80.0–100.0)
Monocytes Absolute: 0.3 10*3/uL (ref 0.1–1.0)
Monocytes Relative: 4 %
Neutro Abs: 8 10*3/uL — ABNORMAL HIGH (ref 1.7–7.7)
Neutrophils Relative %: 88 %
Platelets: 427 10*3/uL — ABNORMAL HIGH (ref 150–400)
RBC: 4.97 MIL/uL (ref 4.22–5.81)
RDW: 12.2 % (ref 11.5–15.5)
WBC: 9.1 10*3/uL (ref 4.0–10.5)
nRBC: 0 % (ref 0.0–0.2)

## 2019-10-02 LAB — MAGNESIUM: Magnesium: 2 mg/dL (ref 1.7–2.4)

## 2019-10-02 LAB — FERRITIN: Ferritin: 460 ng/mL — ABNORMAL HIGH (ref 24–336)

## 2019-10-02 LAB — C-REACTIVE PROTEIN: CRP: 5.8 mg/dL — ABNORMAL HIGH (ref <1.0)

## 2019-10-02 LAB — D-DIMER, QUANTITATIVE: D-Dimer, Quant: 0.58 ug/mL-FEU — ABNORMAL HIGH (ref 0.00–0.50)

## 2019-10-02 NOTE — Progress Notes (Signed)
SATURATION QUALIFICATIONS: (This note is used to comply with regulatory documentation for home oxygen) ? ?Patient Saturations on Room Air at Rest = 93% ? ?Patient Saturations on Room Air while Ambulating = 86% ? ?Patient Saturations on 2 Liters of oxygen while Ambulating = 91% ? ?Please briefly explain why patient needs home oxygen: ?

## 2019-10-02 NOTE — Progress Notes (Signed)
PROGRESS NOTE    Andrew Elliott  [id number] DOB: 22-May-1961 DOA: 09/30/2019 PCP: Glendon Axe, MD   Brief Narrative:  59 year old with history of HTN, GERD, hypothyroidism admitted to the hospital for shortness of breath diagnosed with COVID-19 pneumonia on 2/4.  Outpatient was placed on prednisone, doxycycline, albuterol and Tussionex without any improvement of his symptoms.  In the ER CTA chest was negative for PE initially on 2/13 and was discharged home but he returned back again to the ER due to persistence of his symptoms.  This time he was noted to be hypoxic saturating 87% on room air therefore admitted to the hospital.  Procalcitonin was 0.12, BNP was normal.  Started on Decadron, remdesivir.   Assessment & Plan:   Principal Problem:   Acute hypoxemic respiratory failure due to COVID-19 [name]) Active Problems:   Chest pain   Acute respiratory failure with hypoxia (HCC)  Acute hypoxic respiratory failure secondary to COVID-19 pneumonia; Slowly Improving.  -Oxygen levels-Room Air but Dyspnea on Exertion and tachypnea.  -Remdesivir- D3 -Decadrone- D3 -Actemra- 2/16 -Convalescent plasma- 2/16 -Antibiotics- None -procalcitonin- Neg -Lasix 40mg  IV  (2/17) -CTA chest-bilateral opacities/infiltrates -Supportive care-antitussive, inhalers, I-S/flutter -CODE STATUS confirmed -Vitamin C & Zinc. Prone >16hrs/day.  -Routine: Labs have been reviewed including ferritin, LDH, CRP, d-dimer, fibrinogen.  Will need to trend this lab daily.  Atypical chest discomfort; Resolved.  -Improved.  Likely noncardiac.  Troponins flat.  CTA negative for PE.  Essential hypertension -Not on home meds  Hypothyroidism -Daily Synthroid  DVT prophylaxis: Lovenox Code Status: Full Family Communication:  [name], daughter, updated.  Disposition Plan: Failed outpatient treatment with doxycycline and steroids.  Patient From= home  Patient Anticipated D/C place= Home  Barriers= failed  outpatient treatment.  Intermittently needs 2 L nasal cannula, also dyspneic on exertion.  Once improved we will discharge him.  Likely discharge tomorrow.  Subjective: Feels better today. Oxygen drops below 88% on Room Air.   Review of Systems Otherwise negative except as per HPI, including: General = no fevers, chills, dizziness,  fatigue HEENT/EYES = negative for loss of vision, double vision, blurred vision,  sore throa Cardiovascular= negative for chest pain, palpitation Respiratory/lungs= negative for  hemoptysis Gastrointestinal= negative for nausea, vomiting, abdominal pain Genitourinary= negative for Dysuria MSK = Negative for arthralgia, myalgias Neurology= Negative for headache, numbness, tingling  Psychiatry= Negative for suicidal and homocidal ideation Skin= Negative for Rash   Examination: Constitutional: Not in acute distress;2 L [place].  Respiratory: mild bibasilar rhonchi.  Cardiovascular: Normal sinus rhythm, no rubs Abdomen: Nontender nondistended good bowel sounds Musculoskeletal: No edema noted Skin: No rashes seen Neurologic: CN 2-12 grossly intact.  And nonfocal Psychiatric: Normal judgment and insight. Alert and oriented x 3. Normal mood.   Objective: Vitals:   10/01/19 1937 10/02/19 0030 10/02/19 0322 10/02/19 0731  BP: 117/83 113/83 104/80 125/88  Pulse: 62 63 (!) 58 (!) 59  Resp: 17 19 (!) 23 (!) 23  Temp: 98.3 F (36.8 C) 98.5 F (36.9 C) 98.4 F (36.9 C) 98.2 F (36.8 C)  TempSrc: Oral Oral Oral Oral  SpO2: 94% 96% 92% 91%  Weight:      Height:        Intake/Output Summary (Last 24 hours) at 10/02/2019 0800 Last data filed at 10/02/2019 0300 Gross per 24 hour  Intake 822 ml  Output 900 ml  Net -78 ml   Filed Weights   09/30/19 0605  Weight: 89.5 kg   Data Reviewed:   CBC: Recent  Labs  Lab 09/27/19 1049 09/29/19 2132 09/30/19 0710 10/01/19 0152 10/02/19 0152  WBC 14.1* 10.2 7.4 9.1 9.1  NEUTROABS 12.9*  --  6.4 8.0* 8.0*  HGB  15.6 15.6 15.2 14.7 15.2  HCT 45.5 45.4 44.9 43.2 44.6  MCV 88.3 87.3 88.9 88.5 89.7  PLT 266 315 318 385 [id number]*   Basic Metabolic Panel: Recent Labs  Lab 09/27/19 1049 09/29/19 2132 09/30/19 0710 10/01/19 0152 10/02/19 0152  NA 134* 134* 132* 135 132*  K 3.8 3.7 4.2 4.3 4.3  CL 97* 99 98 100 96*  CO2 25 26 23 25 23   GLUCOSE 155* 116* 269* 178* 192*  BUN 20 19 20 22  29*  CREATININE 0.94 0.90 0.93 0.84 0.92  CALCIUM 9.0 8.5* 8.5* 8.9 8.8*  MG 1.7  --  1.9 2.1 2.0   GFR: Estimated Creatinine Clearance: 90 mL/min (by C-G formula based on SCr of 0.92 mg/dL). Liver Function Tests: Recent Labs  Lab 09/27/19 1049 09/30/19 0710 10/01/19 0152 10/02/19 0152  AST 38 36 42* 47*  ALT 38 38 54* 68*  ALKPHOS 59 62 64 64  BILITOT 0.7 0.8 0.5 0.8  PROT 7.3 7.1 7.4 7.3  ALBUMIN 3.5 3.0* 3.2* 3.1*   No results for input(s): LIPASE, AMYLASE in the last 168 hours. No results for input(s): AMMONIA in the last 168 hours. Coagulation Profile: No results for input(s): INR, PROTIME in the last 168 hours. Cardiac Enzymes: No results for input(s): CKTOTAL, CKMB, CKMBINDEX, TROPONINI in the last 168 hours. BNP (last 3 results) No results for input(s): PROBNP in the last 8760 hours. HbA1C: No results for input(s): HGBA1C in the last 72 hours. CBG: No results for input(s): GLUCAP in the last 168 hours. Lipid Profile: Recent Labs    09/29/19 2153  TRIG 97   Thyroid Function Tests: No results for input(s): TSH, T4TOTAL, FREET4, T3FREE, THYROIDAB in the last 72 hours. Anemia Panel: Recent Labs    10/01/19 0152 10/02/19 0152  FERRITIN 364* 460*   Sepsis Labs: Recent Labs  Lab 09/27/19 1049 09/29/19 2152 09/29/19 2153  PROCALCITON <0.10  --  0.12  LATICACIDVEN  --  1.3  --     Recent Results (from the past 240 hour(s))  Blood Culture (routine x 2)     Status: None (Preliminary result)   Collection Time: 09/29/19  9:53 PM   Specimen: BLOOD  Result Value Ref Range Status    Specimen Description BLOOD LEFT ASSIST CONTROL  Final   Special Requests   Final    BOTTLES DRAWN AEROBIC AND ANAEROBIC Blood Culture adequate volume   Culture   Final    NO GROWTH 3 DAYS Performed at [facility], [address] [place], [place] [address]    Report Status PENDING  Incomplete  Blood Culture (routine x 2)     Status: None (Preliminary result)   Collection Time: 09/29/19  9:54 PM   Specimen: BLOOD  Result Value Ref Range Status   Specimen Description BLOOD LEFT ARM  Final   Special Requests   Final    BOTTLES DRAWN AEROBIC AND ANAEROBIC Blood Culture results may not be optimal due to an inadequate volume of blood received in culture bottles   Culture   Final    NO GROWTH 3 DAYS Performed at [facility], [address] [place], [place] [address]    Report Status PENDING  Incomplete  Respiratory Panel by RT PCR (Flu A&B, Covid) - Nasopharyngeal Swab  Status: Abnormal   Collection Time: 09/30/19 12:18 AM   Specimen: Nasopharyngeal Swab  Result Value Ref Range Status   SARS Coronavirus 2 by RT PCR POSITIVE (A) NEGATIVE Final    Comment: RESULT CALLED TO, READ BACK BY AND VERIFIED WITH: KATE Wende Mott RN 0140 09/30/19 HNM (NOTE) SARS-CoV-2 target nucleic acids are DETECTED. SARS-CoV-2 RNA is generally detectable in upper respiratory specimens  during the acute phase of infection. Positive results are indicative of the presence of the identified virus, but do not rule out bacterial infection or co-infection with other pathogens not detected by the test. Clinical correlation with patient history and other diagnostic information is necessary to determine patient infection status. The expected result is Negative. Fact Sheet for Patients:  PinkCheek.be Fact Sheet for Healthcare Providers: GravelBags.it This test is not yet approved or cleared by the [place] FDA and  has been authorized  for detection and/or diagnosis of SARS-CoV-2 by FDA under an Emergency Use Authorization (EUA).  This EUA will remain in effect (meaning this test can be used) for  the duration of  the COVID-19 declaration under Section 564(b)(1) of the Act, 21 U.S.C. section 360bbb-3(b)(1), unless the authorization is terminated or revoked sooner.    Influenza A by PCR NEGATIVE NEGATIVE Final   Influenza B by PCR NEGATIVE NEGATIVE Final    Comment: (NOTE) The Xpert Xpress SARS-CoV-2/FLU/RSV assay is intended as an aid in  the diagnosis of influenza from Nasopharyngeal swab specimens and  should not be used as a sole basis for treatment. Nasal washings and  aspirates are unacceptable for Xpert Xpress SARS-CoV-2/FLU/RSV  testing. Fact Sheet for Patients: PinkCheek.be Fact Sheet for Healthcare Providers: GravelBags.it This test is not yet approved or cleared by the [place] FDA and  has been authorized for detection and/or diagnosis of SARS-CoV-2 by  FDA under an Emergency Use Authorization (EUA). This EUA will remain  in effect (meaning this test can be used) for the duration of the  Covid-19 declaration under Section 564(b)(1) of the Act, 21  U.S.C. section 360bbb-3(b)(1), unless the authorization is  terminated or revoked. Performed at [facility], [address], [place], [place] [address]          Radiology Studies: No results found.      Scheduled Meds: . vitamin C  500 mg Oral Daily  . dexamethasone (DECADRON) injection  6 mg Intravenous Q24H  . enoxaparin (LOVENOX) injection  40 mg Subcutaneous Q24H  . Ipratropium-Albuterol  1 puff Inhalation Q6H  . levothyroxine  75 mcg Oral QAC breakfast  . sodium chloride flush  3 mL Intravenous Q12H  . sodium chloride flush  3 mL Intravenous Q12H  . zinc sulfate  220 mg Oral Daily   Continuous Infusions: . sodium chloride    . remdesivir 100 mg in NS 100 mL 100 mg  (10/01/19 0839)     LOS: 2 days   Time spent= 25 mins    Ceilidh Torregrossa Arsenio Loader, MD [facility]  If 7PM-7AM, please contact night-coverage  10/02/2019, 8:00 AM

## 2019-10-02 NOTE — Plan of Care (Signed)
  Problem: Education: Goal: Knowledge of risk factors and measures for prevention of condition will improve Outcome: Progressing   Problem: Coping: Goal: Psychosocial and spiritual needs will be supported Outcome: Progressing   Problem: Respiratory: Goal: Will maintain a patent airway Outcome: Progressing Goal: Complications related to the disease process, condition or treatment will be avoided or minimized Outcome: Progressing   

## 2019-10-03 LAB — COMPREHENSIVE METABOLIC PANEL
ALT: 103 U/L — ABNORMAL HIGH (ref 0–44)
AST: 65 U/L — ABNORMAL HIGH (ref 15–41)
Albumin: 3.1 g/dL — ABNORMAL LOW (ref 3.5–5.0)
Alkaline Phosphatase: 65 U/L (ref 38–126)
Anion gap: 12 (ref 5–15)
BUN: 27 mg/dL — ABNORMAL HIGH (ref 8–23)
CO2: 25 mmol/L (ref 22–32)
Calcium: 8.6 mg/dL — ABNORMAL LOW (ref 8.9–10.3)
Chloride: 99 mmol/L (ref 98–111)
Creatinine, Ser: 0.84 mg/dL (ref 0.61–1.24)
GFR calc Af Amer: >60 mL/min (ref >60)
GFR calc non Af Amer: >60 mL/min (ref >60)
Glucose, Bld: 180 mg/dL — ABNORMAL HIGH (ref 70–99)
Potassium: 4.2 mmol/L (ref 3.5–5.1)
Sodium: 136 mmol/L (ref 135–145)
Total Bilirubin: 0.6 mg/dL (ref 0.3–1.2)
Total Protein: 7.1 g/dL (ref 6.5–8.1)

## 2019-10-03 LAB — CBC WITH DIFFERENTIAL/PLATELET
Abs Immature Granulocytes: 0.07 10*3/uL (ref 0.00–0.07)
Basophils Absolute: 0 10*3/uL (ref 0.0–0.1)
Basophils Relative: 0 %
Eosinophils Absolute: 0 10*3/uL (ref 0.0–0.5)
Eosinophils Relative: 0 %
HCT: 44.6 % (ref 39.0–52.0)
Hemoglobin: 15.1 g/dL (ref 13.0–17.0)
Immature Granulocytes: 1 %
Lymphocytes Relative: 10 %
Lymphs Abs: 0.9 10*3/uL (ref 0.7–4.0)
MCH: 29.9 pg (ref 26.0–34.0)
MCHC: 33.9 g/dL (ref 30.0–36.0)
MCV: 88.3 fL (ref 80.0–100.0)
Monocytes Absolute: 0.5 10*3/uL (ref 0.1–1.0)
Monocytes Relative: 5 %
Neutro Abs: 7.6 10*3/uL (ref 1.7–7.7)
Neutrophils Relative %: 84 %
Platelets: 475 10*3/uL — ABNORMAL HIGH (ref 150–400)
RBC: 5.05 MIL/uL (ref 4.22–5.81)
RDW: 12 % (ref 11.5–15.5)
WBC: 9 10*3/uL (ref 4.0–10.5)
nRBC: 0 % (ref 0.0–0.2)

## 2019-10-03 LAB — MAGNESIUM: Magnesium: 2.2 mg/dL (ref 1.7–2.4)

## 2019-10-03 LAB — D-DIMER, QUANTITATIVE: D-Dimer, Quant: 0.66 ug/mL-FEU — ABNORMAL HIGH (ref 0.00–0.50)

## 2019-10-03 LAB — C-REACTIVE PROTEIN: CRP: 2.9 mg/dL — ABNORMAL HIGH (ref <1.0)

## 2019-10-03 LAB — FERRITIN: Ferritin: 446 ng/mL — ABNORMAL HIGH (ref 24–336)

## 2019-10-03 MED ORDER — DEXAMETHASONE 4 MG PO TABS: 4.0000 mg | ORAL_TABLET | Freq: Every day | ORAL | 0 refills | Status: AC

## 2019-10-03 MED ORDER — ALBUTEROL SULFATE HFA 108 (90 BASE) MCG/ACT IN AERS: 1.0000 | INHALATION_SPRAY | Freq: Four times a day (QID) | RESPIRATORY_TRACT | 0 refills | Status: DC | PRN

## 2019-10-03 MED ORDER — GUAIFENESIN-DM 100-10 MG/5ML PO SYRP: 10.0000 mL | ORAL_SOLUTION | ORAL | 0 refills | Status: DC | PRN

## 2019-10-03 NOTE — Progress Notes (Signed)
SATURATION QUALIFICATIONS: (This note is used to comply with regulatory documentation for home oxygen)  Patient Saturations on Room Air at Rest = 93%  Patient Saturations on Room Air while Ambulating = 85%  Patient Saturations on 0 Liters of oxygen while Ambulating = 91%  Please briefly explain why patient needs home oxygen:   Patient was able to ambulate down the hall while remaining on room air. His sats dropped briefly down to 85% but was able to very quickly recover back to 90-91%. Patient denied any distress while walking.

## 2019-10-03 NOTE — Progress Notes (Signed)
Discharge instructions discussed with both the patient and his daughter. Patient questions answered, belongings returned and IV removed. Remains on room air at this time, no s/s of distress, will cont to to monitor. Awaiting daughter's arrival for discharge.

## 2019-10-03 NOTE — Progress Notes (Signed)
Patient scheduled for outpatient Remdesivir infusion at 11:30 AM on Saturday 2/20.  Please advise them to report to [facility] at [address].  Drive to the security guard and tell them you are here for an infusion. They will direct you to the front entrance where we will come and get you.  For questions call [phone].  Thanks   

## 2019-10-03 NOTE — Discharge Summary (Signed)
Physician Discharge Summary  Andrew Elliott [id number] DOB: [birth year] DOA: 09/30/2019  PCP: Glendon Axe, MD  Admit date: 09/30/2019 Discharge date: 10/03/2019  Admitted From: Home Disposition: Home  Recommendations for Outpatient Follow-up:  1. Follow up with PCP in 1-2 weeks 2. Please obtain BMP/CBC in one week your next doctors visit.  3. Decadron for 5 days.  Albuterol as needed inhaler prescribed 4. Outpatient infusion for remdesivir 1 more day.  Discharge Condition: Stable CODE STATUS: Full Diet recommendation: 2 g salt  Brief/Interim Summary: 59 year old with history of HTN, GERD, hypothyroidism admitted to the hospital for shortness of breath diagnosed with COVID-19 pneumonia on 2/4.  Outpatient was placed on prednisone, doxycycline, albuterol and Tussionex without any improvement of his symptoms.  In the ER CTA chest was negative for PE initially on 2/13 and was discharged home but he returned back again to the ER due to persistence of his symptoms.  This time he was noted to be hypoxic saturating 87% on room air therefore admitted to the hospital.  Procalcitonin was 0.12, BNP was normal.  Started on Decadron, remdesivir.  Also received Actemra and convalescent plasma 2/16.  1 dose of Lasix was also given on 2/17.  Over the course of 3 days in the hospital he symptomatically started feeling much better.  CTA chest was negative for PE. At rest he remains on room air, with longer ambulation his oxygen levels dropped down to about 85% with quick recovery without any shortness of breath.  I do not believe he requires home oxygen at this time as he will continue to make recovery.  Daughter updated by me on the day of discharge as well.  Patient stable for discharge.  Acute hypoxic respiratory failure secondary to COVID-19 pneumonia -Greatly improved.  Now on room air.  With ambulation oxygen saturation drops to about 85% with quick recovery and no shortness of breath.  Upon  discharge we will give 5 more days of oral Decadron.  Arrange for 1 more day of outpatient remdesivir infusion. -Received Actemra and convalescent plasma 2/16.  Procalcitonin negative -CTA chest negative for PE, showed bilateral infiltrates.   Atypical chest discomfort -Resolved Likely noncardiac.  Troponins flat.  CTA negative for PE.  Essential hypertension -Not on home meds  Hypothyroidism -Daily Synthroid    Discharge Diagnoses:  Principal Problem:   Acute hypoxemic respiratory failure due to COVID-19 [facility]) Active Problems:   Chest pain   Acute respiratory failure with hypoxia (HCC)    Consultations:  None  Subjective: Feels great, no complaints.  Ambulating in the hallway without any shortness of breath.  Oxygen saturation remains mostly above 90% except 1 time it dropped down to 85%.  Discharge Exam: Vitals:   10/03/19 0514 10/03/19 0728  BP: 118/79 118/82  Pulse: 60 (!) 59  Resp: 18 18  Temp: 98.9 F (37.2 C) 97.7 F (36.5 C)  SpO2: 92% 93%   Vitals:   10/02/19 1644 10/02/19 2000 10/03/19 0514 10/03/19 0728  BP: 121/87 116/85 118/79 118/82  Pulse: 65 67 60 (!) 59  Resp: 20 18 18 18   Temp: 99.1 F (37.3 C) 97.7 F (36.5 C) 98.9 F (37.2 C) 97.7 F (36.5 C)  TempSrc: Oral Oral Oral Oral  SpO2: 92% 92% 92% 93%  Weight:      Height:        General: Pt is alert, awake, not in acute distress Cardiovascular: RRR, S1/S2 +, no rubs, no gallops Respiratory: CTA bilaterally, no wheezing, no rhonchi Abdominal:  Soft, NT, ND, bowel sounds + Extremities: no edema, no cyanosis  Discharge Instructions   Allergies as of 10/03/2019      Reactions   Nsaids Other (See Comments)   Advised not to take NSAIDS due to stomach problems   Amoxicillin Rash      Medication List    TAKE these medications   acetaminophen 500 MG tablet Commonly known as: TYLENOL Take 500 mg by mouth every 6 (six) hours as needed (pain/fever).   albuterol 108 (90 Base) MCG/ACT  inhaler Commonly known as: VENTOLIN HFA Inhale 1-2 puffs into the lungs every 4 (four) hours as needed for wheezing or shortness of breath. What changed: Another medication with the same name was added. Make sure you understand how and when to take each.   albuterol 108 (90 Base) MCG/ACT inhaler Commonly known as: VENTOLIN HFA Inhale 1-2 puffs into the lungs every 6 (six) hours as needed for wheezing or shortness of breath. What changed: You were already taking a medication with the same name, and this prescription was added. Make sure you understand how and when to take each.   dexamethasone 4 MG tablet Commonly known as: DECADRON Take 1 tablet (4 mg total) by mouth daily for 5 days.   diclofenac 75 MG EC tablet Commonly known as: VOLTAREN Take 1 tablet (75 mg total) by mouth 2 (two) times daily. What changed:   when to take this  reasons to take this   doxycycline 100 MG capsule Commonly known as: VIBRAMYCIN Take 100 mg by mouth 2 (two) times daily.   guaiFENesin-dextromethorphan 100-10 MG/5ML syrup Commonly known as: ROBITUSSIN DM Take 10 mLs by mouth every 4 (four) hours as needed for cough.   ibuprofen 600 MG tablet Commonly known as: ADVIL Take 1 tablet (600 mg total) by mouth every 8 (eight) hours as needed. What changed: reasons to take this   levothyroxine 75 MCG tablet Commonly known as: SYNTHROID Take 75 mcg by mouth daily before breakfast.   montelukast 10 MG tablet Commonly known as: SINGULAIR Take 10 mg by mouth at bedtime.   ondansetron 4 MG disintegrating tablet Commonly known as: Zofran ODT Take 1 tablet (4 mg total) by mouth every 8 (eight) hours as needed for nausea or vomiting.   predniSONE 10 MG tablet Commonly known as: DELTASONE Take 10-60 mg by mouth See admin instructions. Take these numbers of tablets on consecutive [id number]      Follow-up Information    Glendon Axe, MD. Schedule an appointment as soon as possible for a visit  in 1 week(s).   Specialty: Internal Medicine Contact information: [address] [place] [address] [phone]          Allergies  Allergen Reactions  . Nsaids Other (See Comments)    Advised not to take NSAIDS due to stomach problems  . Amoxicillin Rash    You were cared for by a hospitalist during your hospital stay. If you have any questions about your discharge medications or the care you received while you were in the hospital after you are discharged, you can call the unit and asked to speak with the hospitalist on call if the hospitalist that took care of you is not available. Once you are discharged, your primary care physician will handle any further medical issues. Please note that no refills for any discharge medications will be authorized once you are discharged, as it is imperative that you return to your primary care physician (or  establish a relationship with a primary care physician if you do not have one) for your aftercare needs so that they can reassess your need for medications and monitor your lab values.   Procedures/Studies: CT Angio Chest PE W and/or Wo Contrast  Result Date: 09/27/2019 CLINICAL DATA:  Shortness of breath for several days with fevers, history of COVID-19 positivity EXAM: CT ANGIOGRAPHY CHEST WITH CONTRAST TECHNIQUE: Multidetector CT imaging of the chest was performed using the standard protocol during bolus administration of intravenous contrast. Multiplanar CT image reconstructions and MIPs were obtained to evaluate the vascular anatomy. CONTRAST:  49mL OMNIPAQUE IOHEXOL 350 MG/ML SOLN COMPARISON:  Chest x-ray from earlier in the same day. FINDINGS: Cardiovascular: Thoracic aorta demonstrates no evidence of aneurysmal dilatation or dissection. No cardiac enlargement is noted. The pulmonary artery shows a normal branching pattern. No filling defects are identified to suggest pulmonary emboli. No significant coronary  calcifications are seen. Mediastinum/Nodes: Thoracic inlet is within normal limits. No sizable hilar or mediastinal adenopathy is noted. The esophagus as visualized is within normal limits. Lungs/Pleura: Lungs are well aerated bilaterally. Patchy ground-glass opacities are noted consistent with the given clinical history of COVID-19 positivity. No sizable effusion is seen. No parenchymal nodules are seen. Upper Abdomen: Visualized upper abdomen is within normal limits. Musculoskeletal: No chest wall abnormality. No acute or significant osseous findings. Review of the MIP images confirms the above findings. IMPRESSION: No evidence of pulmonary emboli. Patchy opacities bilaterally similar to that seen on prior chest x-ray consistent with the given clinical history of COVID-19 positivity. Electronically Signed   By: Inez Catalina M.D.   On: 09/27/2019 12:25   DG Chest Portable 1 View  Result Date: 09/29/2019 CLINICAL DATA:  Shortness of breath with chest pain EXAM: PORTABLE CHEST 1 VIEW COMPARISON:  09/27/2019, 06/07/2015 FINDINGS: Streaky interstitial and patchy ground-glass opacities consistent with bilateral pneumonia. No pleural effusion. Low lung volumes. Cardiomediastinal silhouette within normal limits. No pneumothorax. IMPRESSION: Low lung volumes. Radiographically stable bilateral interstitial and ground-glass opacities, felt to represent viral pneumonia. Electronically Signed   By: Donavan Foil M.D.   On: 09/29/2019 21:59   DG Chest Portable 1 View  Result Date: 09/27/2019 CLINICAL DATA:  COVID positive. Fever for 6 days. EXAM: PORTABLE CHEST 1 VIEW COMPARISON:  Chest x-ray dated 06/07/2015. FINDINGS: Prominence of the upper mediastinal width, possibly related to patient positioning. Borderline cardiomegaly, also possibly accentuated by patient positioning. Patchy subtle opacities at the level the RIGHT mid lung and at the LEFT lung base. No pleural effusion or pneumothorax is seen. Osseous structures  about the chest are unremarkable. IMPRESSION: 1. Subtle patchy airspace opacities bilaterally, suspicious for multifocal pneumonia. 2. Prominence of the upper mediastinal width, possibly related to patient positioning, possibly indicating mediastinal mass or lymphadenopathy. 3. Recommend chest CT for further characterization of both the mediastinal and pulmonary findings. Electronically Signed   By: Franki Cabot M.D.   On: 09/27/2019 10:18      The results of significant diagnostics from this hospitalization (including imaging, microbiology, ancillary and laboratory) are listed below for reference.     Microbiology: Recent Results (from the past 240 hour(s))  Blood Culture (routine x 2)     Status: None (Preliminary result)   Collection Time: 09/29/19  9:53 PM   Specimen: BLOOD  Result Value Ref Range Status   Specimen Description BLOOD LEFT ASSIST CONTROL  Final   Special Requests   Final    BOTTLES DRAWN AEROBIC AND ANAEROBIC Blood Culture adequate volume  Culture   Final    NO GROWTH 4 DAYS Performed at [facility], [address] [place], [place] [address]    Report Status PENDING  Incomplete  Blood Culture (routine x 2)     Status: None (Preliminary result)   Collection Time: 09/29/19  9:54 PM   Specimen: BLOOD  Result Value Ref Range Status   Specimen Description BLOOD LEFT ARM  Final   Special Requests   Final    BOTTLES DRAWN AEROBIC AND ANAEROBIC Blood Culture results may not be optimal due to an inadequate volume of blood received in culture bottles   Culture   Final    NO GROWTH 4 DAYS Performed at [facility], [address], [place], [place] [address]    Report Status PENDING  Incomplete  Respiratory Panel by RT PCR (Flu A&B, Covid) - Nasopharyngeal Swab     Status: Abnormal   Collection Time: 09/30/19 12:18 AM   Specimen: Nasopharyngeal Swab  Result Value Ref Range Status   SARS Coronavirus 2 by RT PCR POSITIVE (A) NEGATIVE Final     Comment: RESULT CALLED TO, READ BACK BY AND VERIFIED WITH: Adelene Idler RN 0140 09/30/19 HNM (NOTE) SARS-CoV-2 target nucleic acids are DETECTED. SARS-CoV-2 RNA is generally detectable in upper respiratory specimens  during the acute phase of infection. Positive results are indicative of the presence of the identified virus, but do not rule out bacterial infection or co-infection with other pathogens not detected by the test. Clinical correlation with patient history and other diagnostic information is necessary to determine patient infection status. The expected result is Negative. Fact Sheet for Patients:  PinkCheek.be Fact Sheet for Healthcare Providers: GravelBags.it This test is not yet approved or cleared by the [place] FDA and  has been authorized for detection and/or diagnosis of SARS-CoV-2 by FDA under an Emergency Use Authorization (EUA).  This EUA will remain in effect (meaning this test can be used) for  the duration of  the COVID-19 declaration under Section 564(b)(1) of the Act, 21 U.S.C. section 360bbb-3(b)(1), unless the authorization is terminated or revoked sooner.    Influenza A by PCR NEGATIVE NEGATIVE Final   Influenza B by PCR NEGATIVE NEGATIVE Final    Comment: (NOTE) The Xpert Xpress SARS-CoV-2/FLU/RSV assay is intended as an aid in  the diagnosis of influenza from Nasopharyngeal swab specimens and  should not be used as a sole basis for treatment. Nasal washings and  aspirates are unacceptable for Xpert Xpress SARS-CoV-2/FLU/RSV  testing. Fact Sheet for Patients: PinkCheek.be Fact Sheet for Healthcare Providers: GravelBags.it This test is not yet approved or cleared by the [place] FDA and  has been authorized for detection and/or diagnosis of SARS-CoV-2 by  FDA under an Emergency Use Authorization (EUA). This EUA will remain  in  effect (meaning this test can be used) for the duration of the  Covid-19 declaration under Section 564(b)(1) of the Act, 21  U.S.C. section 360bbb-3(b)(1), unless the authorization is  terminated or revoked. Performed at [facility], [address] [place], [place] [address]      Labs: BNP (last 3 results) Recent Labs    09/29/19 2132  BNP [id number]   Basic Metabolic Panel: Recent Labs  Lab 09/27/19 1049 09/27/19 1049 09/29/19 2132 09/30/19 0710 10/01/19 0152 10/02/19 0152 10/03/19 0013  NA 134*   < > 134* 132* 135 132* 136  K 3.8   < > 3.7 4.2 4.3 4.3 4.2  CL 97*   < >  99 98 100 96* 99  CO2 25   < > 26 23 25 23 25   GLUCOSE 155*   < > 116* 269* 178* 192* 180*  BUN 20   < > 19 20 22  29* 27*  CREATININE 0.94   < > 0.90 0.93 0.84 0.92 0.84  CALCIUM 9.0   < > 8.5* 8.5* 8.9 8.8* 8.6*  MG 1.7  --   --  1.9 2.1 2.0 2.2   < > = values in this interval not displayed.   Liver Function Tests: Recent Labs  Lab 09/27/19 1049 09/30/19 0710 10/01/19 0152 10/02/19 0152 10/03/19 0013  AST 38 36 42* 47* 65*  ALT 38 38 54* 68* 103*  ALKPHOS 59 62 64 64 65  BILITOT 0.7 0.8 0.5 0.8 0.6  PROT 7.3 7.1 7.4 7.3 7.1  ALBUMIN 3.5 3.0* 3.2* 3.1* 3.1*   No results for input(s): LIPASE, AMYLASE in the last 168 hours. No results for input(s): AMMONIA in the last 168 hours. CBC: Recent Labs  Lab 09/27/19 1049 09/27/19 1049 09/29/19 2132 09/30/19 0710 10/01/19 0152 10/02/19 0152 10/03/19 0013  WBC 14.1*   < > 10.2 7.4 9.1 9.1 9.0  NEUTROABS 12.9*  --   --  6.4 8.0* 8.0* 7.6  HGB 15.6   < > 15.6 15.2 14.7 15.2 15.1  HCT 45.5   < > 45.4 44.9 43.2 44.6 44.6  MCV 88.3   < > 87.3 88.9 88.5 89.7 88.3  PLT 266   < > 315 318 385 427* 475*   < > = values in this interval not displayed.   Cardiac Enzymes: No results for input(s): CKTOTAL, CKMB, CKMBINDEX, TROPONINI in the last 168 hours. BNP: Invalid input(s): POCBNP CBG: No results for input(s): GLUCAP in the last 168  hours. D-Dimer Recent Labs    10/02/19 0152 10/03/19 0013  DDIMER 0.58* 0.66*   Hgb A1c No results for input(s): HGBA1C in the last 72 hours. Lipid Profile No results for input(s): CHOL, HDL, LDLCALC, TRIG, CHOLHDL, LDLDIRECT in the last 72 hours. Thyroid function studies No results for input(s): TSH, T4TOTAL, T3FREE, THYROIDAB in the last 72 hours.  Invalid input(s): FREET3 Anemia work up [employer]    10/02/19 0152 10/03/19 0013  FERRITIN 460* 446*   Urinalysis No results found for: COLORURINE, APPEARANCEUR, LABSPEC, [place], GLUCOSEU, [place], [place], [place], [place], UROBILINOGEN, NITRITE, LEUKOCYTESUR Sepsis Labs Invalid input(s): PROCALCITONIN,  WBC,  LACTICIDVEN Microbiology Recent Results (from the past 240 hour(s))  Blood Culture (routine x 2)     Status: None (Preliminary result)   Collection Time: 09/29/19  9:53 PM   Specimen: BLOOD  Result Value Ref Range Status   Specimen Description BLOOD LEFT ASSIST CONTROL  Final   Special Requests   Final    BOTTLES DRAWN AEROBIC AND ANAEROBIC Blood Culture adequate volume   Culture   Final    NO GROWTH 4 DAYS Performed at [facility], [address], [place], [place] [address]    Report Status PENDING  Incomplete  Blood Culture (routine x 2)     Status: None (Preliminary result)   Collection Time: 09/29/19  9:54 PM   Specimen: BLOOD  Result Value Ref Range Status   Specimen Description BLOOD LEFT ARM  Final   Special Requests   Final    BOTTLES DRAWN AEROBIC AND ANAEROBIC Blood Culture results may not be optimal due to an inadequate volume of blood received in culture bottles   Culture   Final  NO GROWTH 4 DAYS Performed at [facility], [address] [place], [place] [address]    Report Status PENDING  Incomplete  Respiratory Panel by RT PCR (Flu A&B, Covid) - Nasopharyngeal Swab     Status: Abnormal   Collection Time: 09/30/19 12:18 AM   Specimen: Nasopharyngeal Swab  Result  Value Ref Range Status   SARS Coronavirus 2 by RT PCR POSITIVE (A) NEGATIVE Final    Comment: RESULT CALLED TO, READ BACK BY AND VERIFIED WITH: Adelene Idler RN 0140 09/30/19 HNM (NOTE) SARS-CoV-2 target nucleic acids are DETECTED. SARS-CoV-2 RNA is generally detectable in upper respiratory specimens  during the acute phase of infection. Positive results are indicative of the presence of the identified virus, but do not rule out bacterial infection or co-infection with other pathogens not detected by the test. Clinical correlation with patient history and other diagnostic information is necessary to determine patient infection status. The expected result is Negative. Fact Sheet for Patients:  PinkCheek.be Fact Sheet for Healthcare Providers: GravelBags.it This test is not yet approved or cleared by the [place] FDA and  has been authorized for detection and/or diagnosis of SARS-CoV-2 by FDA under an Emergency Use Authorization (EUA).  This EUA will remain in effect (meaning this test can be used) for  the duration of  the COVID-19 declaration under Section 564(b)(1) of the Act, 21 U.S.C. section 360bbb-3(b)(1), unless the authorization is terminated or revoked sooner.    Influenza A by PCR NEGATIVE NEGATIVE Final   Influenza B by PCR NEGATIVE NEGATIVE Final    Comment: (NOTE) The Xpert Xpress SARS-CoV-2/FLU/RSV assay is intended as an aid in  the diagnosis of influenza from Nasopharyngeal swab specimens and  should not be used as a sole basis for treatment. Nasal washings and  aspirates are unacceptable for Xpert Xpress SARS-CoV-2/FLU/RSV  testing. Fact Sheet for Patients: PinkCheek.be Fact Sheet for Healthcare Providers: GravelBags.it This test is not yet approved or cleared by the [place] FDA and  has been authorized for detection and/or diagnosis of  SARS-CoV-2 by  FDA under an Emergency Use Authorization (EUA). This EUA will remain  in effect (meaning this test can be used) for the duration of the  Covid-19 declaration under Section 564(b)(1) of the Act, 21  U.S.C. section 360bbb-3(b)(1), unless the authorization is  terminated or revoked. Performed at [facility], [address] [place], [place] [address]      Time coordinating discharge:  I have spent 35 minutes face to face with the patient and on the ward discussing the patients care, assessment, plan and disposition with other care givers. >50% of the time was devoted counseling the patient about the risks and benefits of treatment/Discharge disposition and coordinating care.   SIGNED:   Damita Lack, MD  [facility] 10/03/2019, 9:58 AM   If 7PM-7AM, please contact night-coverage

## 2019-10-04 ENCOUNTER — Ambulatory Visit (HOSPITAL_COMMUNITY)
Admission: RE | Admit: 2019-10-04 | Discharge: 2019-10-04 | Disposition: A | Discharge status: Home / Self Care | Payer: BC Managed Care – PPO | Source: Ambulatory Visit | Attending: Pulmonary Disease | Admitting: Pulmonary Disease

## 2019-10-04 VITALS — BP 114/86 | HR 73 | Temp 98.5°F | Resp 18

## 2019-10-04 DIAGNOSIS — U071 COVID-19: Secondary | ICD-10-CM | POA: Diagnosis present

## 2019-10-04 LAB — CULTURE, BLOOD (ROUTINE X 2)
Culture: NO GROWTH
Culture: NO GROWTH
Special Requests: ADEQUATE

## 2019-10-04 MED ORDER — SODIUM CHLORIDE 0.9 % IV SOLN: INTRAVENOUS | Status: DC | PRN

## 2019-10-04 MED ORDER — EPINEPHRINE 0.3 MG/0.3ML IJ SOAJ: 0.3000 mg | Freq: Once | INTRAMUSCULAR | Status: DC | PRN

## 2019-10-04 MED ORDER — DIPHENHYDRAMINE HCL 50 MG/ML IJ SOLN: 50.0000 mg | Freq: Once | INTRAMUSCULAR | Status: DC | PRN

## 2019-10-04 MED ORDER — ALBUTEROL SULFATE HFA 108 (90 BASE) MCG/ACT IN AERS: 2.0000 | INHALATION_SPRAY | Freq: Once | RESPIRATORY_TRACT | Status: DC | PRN

## 2019-10-04 MED ORDER — FAMOTIDINE IN NACL 20-0.9 MG/50ML-% IV SOLN: 20.0000 mg | Freq: Once | INTRAVENOUS | Status: DC | PRN

## 2019-10-04 MED ORDER — SODIUM CHLORIDE 0.9 % IV SOLN
100.0000 mg | Freq: Once | INTRAVENOUS | Status: AC
  Administered 2019-10-04: 100 mg via INTRAVENOUS
  Filled 2019-10-04: qty 20

## 2019-10-04 MED ORDER — METHYLPREDNISOLONE SODIUM SUCC 125 MG IJ SOLR: 125.0000 mg | Freq: Once | INTRAMUSCULAR | Status: DC | PRN

## 2019-10-04 NOTE — Progress Notes (Signed)
  Diagnosis: COVID-19  Physician: Dr. Joya Gaskins  Procedure: [facility] Infusion Clinic Med: remdesivir infusion.  Complications: No immediate complications noted.  Discharge: Discharged home   Janine Ores 10/04/2019

## 2019-10-04 NOTE — Discharge Instructions (Signed)
Preguntas frecuentes sobre el COVID-19 COVID-19 Frequently Asked Questions El COVID-19 (enfermedad por coronavirus) es una infeccin causada por una gran familia de virus. Algunos virus causan [employer] y otros causan enfermedades en animales tales como los camellos, los gatos y los murcilagos. En algunos casos, los virus que causan [employer] pueden transmitirse a los seres humanos. De dnde provino el coronavirus? En diciembre de 2019, [place] le inform a Advertising account executive (Organizacin Mundial de la [place], Medical laboratory scientific officer) acerca de varios casos de enfermedad pulmonar (enfermedad respiratoria humana). Estos casos estaban vinculados con un mercado abierto de frutos de mar y [place] en [place]. El vnculo con el mercado de ganado y [employer] sugiere que el virus puede haberse propagado de los animales a los [place]. Sin embargo, desde Engineer, materials brote en diciembre, tambin se ha demostrado que el virus se contagia de [place] persona a Theatre manager. Cul es el nombre de la enfermedad y del virus? Nombre de la enfermedad Al principio, esta enfermedad se llam nuevo coronavirus. Esto se debe a que los cientficos determinaron que la enfermedad era causada por un nuevo virus respiratorio. [employer] (Organizacin Mundial de la [place], [place]) ahora ha dado a la enfermedad el nombre de COVID-19, o enfermedad por coronavirus. Nombre del virus El virus causante de la enfermedad se conoce como coronavirus de tipo 2 causante del sndrome respiratorio agudo grave (SARS-CoV-2). Ms informacin sobre el nombre de la enfermedad y el virus [facility] (Organizacin Mundial de la [place]) (OMS): www.who.int/emergencies/diseases/novel-coronavirus-2019/technical-guidance/naming-the-coronavirus-disease-(covid-2019)-and-the-virus-that-causes-it Quines estn en riesgo de sufrir complicaciones debido a la enfermedad por coronavirus? Algunas personas pueden  tener un riesgo ms alto de tener complicaciones debido a la enfermedad por coronavirus. Entre ellas se encuentran los [employer] y las personas que tienen enfermedades crnicas, como enfermedad cardaca, diabetes y enfermedad pulmonar. Si tiene un riesgo ms alto de Advice worker, tome estas precauciones adicionales:  Public affairs consultant en su casa todo lo que sea posible.  [place] reuniones sociales y los viajes.  Evitar el contacto cercano con Producer, television/film/video. Permanecer a una distancia de al menos 6 pies (2 m) de las Standard Pacific, si es posible.  Lavarse las manos frecuentemente con agua y [place] durante al menos 20segundos.  Evitar tocarse la cara, la boca, la nariz y los ojos.  Tener a American International Group su casa, como alimentos, medicamentos y productos de limpieza.  Si debe salir de su casa, use un barbijo de tela o una mascarilla facial. Asegrese de que le cubra la nariz y la boca. Cmo se transmite la enfermedad causada por el coronavirus? El virus que causa la enfermedad por coronavirus se transmite fcilmente de Ardelia Mems persona a otra (es contagioso). Usted puede contagiarse con este virus:  Al inspirar las gotitas de una persona infectada. Las [employer] Stores pueden diseminarse cuando una persona respira, habla, canta, tose o estornuda.  Al tocar algo, como una mesa o el picaportes de [place], que estuvo expuesto al virus (contaminado) y luego tocarse la boca, nariz o los ojos. Puedo contraer al virus al tocar superficies u objetos? Todava hay mucho que no se conoce acerca del virus que causa la enfermedad por coronavirus. Los cientficos basan gran parte de la informacin en lo que saben sobre virus similares, por ejemplo:  En general, los virus no sobreviven en superficies durante mucho tiempo. Necesitan un cuerpo humano (husped) para sobrevivir.  Es ms probable que el virus se contagie por contacto cercano con  personas que estn enfermas (contacto directo), por  ejemplo: ? Al estrechar las manos o abrazarse. ? Al inhalar las gotitas respiratorias que se desplazan por el aire. Las [employer] Stores pueden diseminarse cuando una persona respira, habla, canta, tose o estornuda.  Es menos probable que el virus se propague cuando una persona toca una superficie o un objeto sobre el que est el virus (contacto indirecto). El virus puede ingresar al cuerpo si la persona toca una superficie o un objeto y [employer] se toca la cara, los ojos, la nariz o la boca. Una persona puede contagiar el virus sin tener sntomas de la enfermedad? Puede ser posible que el virus se contagie antes de que la persona tenga sntomas de la enfermedad, pero muy probablemente esta no sea la principal forma en que el virus se est propagando. Es ms probable que el virus se propague al estar en contacto estrecho con personas que estn enfermas e inhalar las gotas respiratorias que una persona disemina al respirar, Electrical engineer, cantar, toser o estornudar. Cules son los sntomas de la enfermedad causada por el coronavirus? Los sntomas varan de Ardelia Mems persona a otra y pueden variar de leves a graves. [employer], se pueden incluir los siguientes:  Systems analyst o escalofros.  Tos.  Dificultad para respirar o falta de aire.  Dolores de [place], dolores en el cuerpo o dolores musculares.  Secrecin o congestin nasal.  El dolor de garganta.  Nueva prdida del sentido del gusto o del olfato.  Nuseas, vmitos o diarrea. Estos sntomas pueden aparecer en el trmino de 2 a [address] despus de haber estado expuesto al virus. Algunas personas quizs no tengan sntomas. Si presenta sntomas, llame al mdico. Las personas con sntomas graves pueden necesitar atencin hospitalaria. Debo hacerme un anlisis de deteccin del virus? El mdico decidir si debe realizarse un anlisis en funcin de sus sntomas, antecedentes de exposicin y factores de [place]. Cmo realiza el mdico el anlisis para detectar  este virus? Los mdicos obtienen muestras para enviar a Physiological scientist. Estas muestras pueden incluir lo siguiente:  Tomar con un hisopo Truddie Coco de lquido de la parte posterior de la nariz y la garganta, la nariz o la garganta.  Pedirle que tosa mucosidad (esputo) para extraer lquido de los pulmones en un recipiente estril.  Tomar una muestra de [place]. Hay algn tratamiento o vacuna para este virus? Actualmente, no existe ninguna vacuna para prevenir la enfermedad por coronavirus. Adems, no existen [employer] antibiticos o los antivirales para tratar el virus. Una persona que se enferma recibe tratamiento de apoyo, lo que significa reposo y lquidos. Una persona tambin puede aliviar sus sntomas con medicamentos de venta libre para tratar los estornudos, la tos y el goteo nasal. Son los mismos medicamentos que se toman para el resfro comn. Si presenta sntomas, llame al mdico. Las personas con sntomas graves pueden necesitar atencin hospitalaria. Qu puedo hacer para protegerme y proteger a mi familia de este virus?     Puede protegerse y proteger a su familia tomando las mismas medidas que tomara para prevenir el contagio de otros virus. [employer] Pet[employer] las siguientes medidas:  Lavarse las manos frecuentemente con agua y [place] durante al menos 20segundos. Usar desinfectante para manos con alcohol si no dispone de [place] y [place].  Evitar tocarse la cara, la boca, la nariz y los ojos.  Toser o estornudar en un pauelo descartable, sobre su manga o codo. No toser o estornudar al aire ni cubrirse con la [place]. ? Si tose  o estornuda en un pauelo de papel, deschelo inmediatamente y [employer].  Desinfectar los [employer]ie y las superficies que se tocan con frecuencia todos [place].  Aljese de [place].  Evite salir de su casa, siga las indicaciones de su estado y [place] autoridades sanitarias locales.  Evite los espacios interiores repletos de gente. Permanezca  a una distancia de al menos 6 pies (2 m) de las Standard Pacific.  Si debe salir de su casa, use un barbijo de tela o una mascarilla facial. Asegrese de que le cubra la nariz y la boca.  Jimmye Norman en su casa si est enfermo, excepto para obtener atencin mdica. Llame al mdico antes de buscar atencin mdica. El mdico le indicar cunto tiempo debe quedarse en casa.  Asegrese de [place] vacunas al da. Pregntele al mdico qu vacunas necesita. Qu debo hacer si tengo que viajar? Siga las recomendaciones relacionadas con los viajes de la autoridad de Arboriculturist, los CDC y Heritage manager. Informacin y consejos para Archivist for Disease Control and Prevention Librarian, academic) (Centros para el Control y la Prevencin de Arboriculturist): BodyEditor.hu  Organizacin [place] (OMS): ThirdIncome.ca [employer] riesgos y tome medidas para proteger su salud  El riesgo de Museum/gallery curator la enfermedad por coronavirus es ms alto si viaja a zonas con un brote o si est en contacto con viajeros que provienen de zonas donde hay un brote.  Lvese las manos con frecuencia y [place] higiene [place] para reducir el riesgo de contagiarse o transmitir el virus. Qu debo hacer si estoy enfermo? Instrucciones generales para detener la propagacin de la infeccin  Lavarse las manos frecuentemente con agua y jabn durante al menos 20segundos. Usar desinfectante para manos con alcohol si no dispone de [place] y [place].  Toser o estornudar en un pauelo descartable, sobre su manga o codo. No toser o estornudar al aire ni cubrirse con la [place].  Si tose o estornuda en un pauelo de papel, deschelo inmediatamente y General Electric.  Foy [place] en su casa a menos que deba recibir [employer] Corporation. Llame al mdico o a la autoridad de salud local antes de buscar atencin mdica.  Evite las zonas pblicas. No viaje en  transporte pblico, de ser posible.  Si puede, use un barbijo si debe salir de la casa o si est en contacto cercano con alguien que no est enfermo. Asegrese de que le cubra la nariz y la boca. Mantenga su casa limpia  Desinfecte los objetos y las superficies que se tocan con frecuencia todos [place]. Pueden incluir: ? Encimeras y [place]. ? Picaportes e interruptores de luz. ? Lavabos, fregaderos y grifos. ? Aparatos electrnicos tales como telfonos, controles remotos, teclados, computadoras y tabletas.  Lave los platos con agua jabonosa caliente o en el lavavajillas. Deje los platos para que se sequen al aire.  Lave la ropa con agua caliente. Evite infectar a otros miembros de la familia  Permita que los miembros de la familia sanos cuiden a los nios y las [place], si es posible. Si tiene que cuidar a los nios o las mascotas, lvese las manos con frecuencia y use un barbijo.  Duerma en una habitacin o cama diferentes, si es posible.  No comparta elementos personales, como afeitadoras, cepillos de dientes, desodorantes, peines, cepillos, toallas y toallitas de mano. Dnde buscar ms informacin Centers for Disease Control and Prevention (CDC)  Actualizaciones de informacin y novedades: https://www.butler-gonzalez.com/ Organizacin Mundial de la Salud (OMS)  Actualizaciones de informacin y novedades: MissExecutive.com.ee  Tema de salud relacionado con el coronavirus: https://www.castaneda.info/  Preguntas y [employer] COVID-19: OpportunityDebt.at  Registro mundial: who.sprinklr.com American Academy of Pediatrics (AAP) ([place])  Informacin para familias: www.healthychildren.org/English/health-issues/conditions/chest-lungs/Pages/2019-Novel-Coronavirus.aspx La situacin del coronavirus cambia rpidamente. Consulte el sitio web de su autoridad de Arboriculturist o  los sitios web de los CDC y la OMS para enterarse [place] novedades y noticias. Cundo debo comunicarme con un mdico?  Comunquese con su mdico si tiene sntomas de infeccin, como fiebre o tos, y: ? [place] de alguien que sabe que tiene la enfermedad por coronavirus. ? Devota Pace en contacto con una persona que presuntamente sufra de la enfermedad por coronavirus. ? Ha viajado a una zona donde hay un brote de COVID-19. Cundo debo buscar asistencia mdica inmediata?  Busque ayuda de inmediato llamando al servicio de emergencias de su localidad (911 en los [place]) si tiene lo siguiente: ? Dificultad para respirar. ? Dolor u opresin en el pecho. ? Confusin. ? Labios y uas de [employer]. ? Dificultad para despertarse. ? Sntomas que empeoran. Informe al personal mdico de emergencias si cree que tiene la enfermedad por coronavirus. Resumen  Un nuevo virus respiratorio se propaga de Ardelia Mems persona a otra y causa COVID-19 (enfermedad por coronavirus).  El virus que causa el COVID-19 parece diseminarse fcilmente. Se transmite de [place] persona a otra a travs de las [employer] se despiden al respirar, Electrical engineer, cantar, toser o estornudar.  Los [employer] Corporation y las personas que tienen enfermedades crnicas tienen mayor riesgo de Emergency planning/management officer enfermedad. Si tiene un riesgo ms alto de tener complicaciones, tome Geophysical data processor.  Actualmente, no existe ninguna vacuna para prevenir la enfermedad por coronavirus. No existen medicamentos, como los antibiticos o los antivirales, para tratar el virus.  Puede protegerse y proteger a su familia al lavarse las manos con frecuencia, evitar tocarse la cara y cubrirse al toser y Brewing technologist. Esta informacin no tiene Marine scientist el consejo del mdico. Asegrese de hacerle al mdico cualquier pregunta que tenga. Document Revised: 06/05/2019 Document Reviewed: 12/09/2018 Elsevier Patient Education  [place].   COVID-19 COVID-19 El COVID-19 es una infeccin respiratoria causada por un virus llamado coronavirus tipo 2 causante del sndrome respiratorio agudo grave (SARS-CoV-2). La enfermedad tambin se conoce como enfermedad por coronavirus o nuevo coronavirus. En algunas personas, el virus puede no ocasionar sntomas. En otras, puede producir una infeccin grave. La infeccin puede empeorar rpidamente y causar complicaciones, como:  Neumona o infeccin en los pulmones.  Sndrome de dificultad respiratoria aguda o SDRA. Es una afeccin que se caracteriza por la acumulacin de lquido en los pulmones, que impide que los pulmones se llenen de aire y pasen oxgeno a Herbalist.  Insuficiencia respiratoria aguda. Es una afeccin que se caracteriza porque no pasa suficiente oxgeno de los pulmones al cuerpo o porque el dixido de carbono no pasa de los pulmones hacia afuera del cuerpo.  Sepsis o choque sptico. Se trata de una reaccin grave del cuerpo ante una infeccin.  Problemas de coagulacin.  Infecciones secundarias debido a bacterias u hongos.  Falla de rganos. Ocurre cuando los rganos del cuerpo dejan de funcionar. El virus que causa el COVID-19 es contagioso. Esto significa que puede transmitirse de [place] persona a otra a travs de las gotitas de saliva de la tos y de los estornudos (secreciones respiratorias). Cules son las causas? Esta enfermedad es causada por un virus. Usted puede  contagiarse con este virus:  Al inspirar las gotitas de una persona infectada. Las [employer] Stores pueden diseminarse cuando una persona respira, habla, canta, tose o estornuda.  Al tocar algo, como una mesa o el picaportes de [place], que estuvo expuesto al virus (contaminado) y luego tocarse la boca, nariz o los ojos. Qu incrementa el riesgo? Riesgo de infeccin Es ms probable que se infecte con este virus si:  Se encuentra a Physiological scientist a 6 pies (2 metros) de Arts administrator con  COVID-19.  Cuida o vive con una persona infectada con COVID-19.  Pasa tiempo en espacios interiores repletos de gente o vive en viviendas compartidas. Riesgo de enfermedad grave Es ms probable que se enferme gravemente por el virus si:  Tiene 50aos o ms. Cuanto mayor sea su edad, mayor ser el riesgo de tener una enfermedad grave.  Vive en un hogar de ancianos o centro de atencin a Barrister's clerk.  Tiene cncer.  Tiene una enfermedad prolongada (crnica), como las siguientes: ? Enfermedad pulmonar crnica, que incluye la enfermedad pulmonar obstructiva crnica o asma. ? Una enfermedad crnica que disminuye la capacidad del cuerpo para combatir las infecciones (immunocomprometido). ? Enfermedad cardaca, que incluye insuficiencia cardaca, una afeccin que se caracteriza porque las arterias que llegan al corazn se Investment banker, corporate u obstruyen (arteriopata coronaria) o una enfermedad que provoca que el msculo cardaco se engrose, se debilite o endurezca (miocardiopata). ? Diabetes. ? Enfermedad renal crnica. ? Anemia drepanoctica, una enfermedad que se caracteriza porque los glbulos rojos tienen una forma anormal de "hoz". ? Enfermedad heptica.  Es obeso. Cules son los signos o sntomas? Los sntomas de esta afeccin pueden ser de leves a graves. Los sntomas pueden aparecer en el trmino de 2 a [address] despus de haber estado expuesto al virus. Incluyen los siguientes:  Fiebre o escalofros.  Tos.  Dificultad para respirar.  Dolores de [place], dolores en el cuerpo o dolores musculares.  Secrecin o congestin nasal.  Dolor de garganta.  Nueva prdida del sentido del gusto o del olfato. Algunas personas tambin pueden [employer], como nuseas, vmitos o diarrea. Es posible que otras personas no tengan sntomas de COVID-19. Cmo se diagnostica? Esta afeccin se puede diagnosticar en funcin de lo siguiente:  Sus signos y sntomas, especialmente  si: ? Vive en una zona donde hay un brote de COVID-19. ? Viaj recientemente a una zona donde el virus es frecuente. ? Cuida o vive con Ardelia Mems persona a quien se le diagnostic COVID-19. ? [address] expuesto a una persona a la que se le diagnostic COVID-19.  Un examen fsico.  Anlisis de laboratorio que pueden incluir: ? Tomar una muestra de lquido de la parte posterior de la nariz y la garganta (lquido nasofarngeo), la nariz o la garganta, con un hisopo. ? Una muestra de mucosidad de los pulmones (esputo). ? Anlisis de [place].  Los estudios de diagnstico por imgenes pueden incluir radiografas, exploracin por tomografa computarizada (TC) o ecografa. Cmo se trata? En este momento, no hay ningn medicamento para tratar el COVID-19. Los medicamentos para tratar otras enfermedades se usan a modo de ensayo para comprobar si son eficaces contra el COVID-19. El mdico le informar sobre las maneras de tratar los sntomas. En la [employer], la infeccin es leve y puede controlarse en el hogar con reposo, lquidos y medicamentos de [place]. El tratamiento para una infeccin grave suele realizarse en la unidad de cuidados intensivos (UCI) de un hospital. Puede incluir [place] o  ms de los siguientes. Estos tratamientos se administran hasta que los sntomas mejoran.  Recibir lquidos y [employer] a travs de una va intravenosa.  Oxgeno complementario. Para administrar oxgeno extra, se [place] un tubo en la Doran [place], una mascarilla o una campana de oxgeno.  Colocarlo para que se recueste boca abajo (decbito prono). Esto facilita el ingreso de oxgeno a los pulmones.  Uso continuo de [place] de presin positiva de las vas areas (CPAP) o de presin positiva de las vas areas de dos niveles (BPAP). Este tratamiento utiliza una presin de aire leve para Theatre manager las vas respiratorias abiertas. Un tubo conectado a un motor administra oxgeno al cuerpo.  Respirador. Este  tratamiento mueve el aire dentro y fuera de los pulmones mediante el uso de un tubo que se coloca en la trquea.  Traqueostoma. En este procedimiento se hace un orificio en el cuello para insertar un tubo de respiracin.  Oxigenacin por membrana extracorprea (OMEC). En este procedimiento, los pulmones tienen la posibilidad de recuperarse al asumir las funciones del corazn y los pulmones. Suministra oxgeno al cuerpo y elimina el dixido de carbono. Siga estas instrucciones en su casa: Estilo de vida  Si est enfermo, qudese en su casa, excepto para obtener atencin mdica. El mdico le indicar cunto tiempo debe quedarse en casa. Llame al mdico antes de buscar atencin mdica.  Haga reposo en su casa como se lo haya indicado el mdico.  No consuma ningn producto que contenga nicotina o tabaco, como cigarrillos, cigarrillos electrnicos y tabaco de Higher education careers adviser. Si necesita ayuda para dejar de fumar, consulte al mdico.  Retome sus actividades normales segn lo indicado por el mdico. Pregntele al mdico qu actividades son seguras para usted. Instrucciones generales  Use los medicamentos de venta libre y los recetados solamente como se lo haya indicado el mdico.  Beba suficiente lquido como para Theatre manager la orina de color amarillo plido.  Concurra a todas las visitas de seguimiento como se lo haya indicado el mdico. Esto es importante. Cmo se evita?  No hay ninguna vacuna que ayude a prevenir la infeccin por COVID-19. Sin embargo, hay medidas que puede tomar para protegerse y Museum/gallery curator a Producer, television/film/video de este virus. Para protegerse:   No viaje a zonas donde el COVID-19 sea un riesgo. Las zonas donde se informa la presencia del COVID-19 cambian con frecuencia. Para identificar las zonas de alto riesgo y las restricciones de viaje, consulte el sitio web de viajes de Garment/textile technologist for [employer] and Prevention Librarian, academic) (Centros para el Control y la Prevencin de Arboriculturist):  FatFares.com.br  Si vive o debe viajar a una zona donde el COVID-19 es un riesgo, tome precauciones para evitar infecciones. ? Aljese de [place]. ? Lvese las manos frecuentemente con agua y Luis M. Cintron. Use desinfectante para manos con alcohol si no dispone de [place] y [place]. ? Evite tocarse la boca, la cara, los ojos o la [place]. ? Evite salir de su casa, siga las indicaciones de su estado y [place] autoridades sanitarias locales. ? Si debe salir de su casa, use un barbijo de tela o una mascarilla facial. Asegrese de que le cubra la nariz y la boca. ? Evite los espacios interiores repletos de gente. Mantenga una distancia de al menos 6 pies (2 metros) de Producer, television/film/video. ? Desinfecte los objetos y las superficies que se tocan con frecuencia todos [place]. Pueden incluir:  Encimeras y [place].  Picaportes e interruptores de luz.  Lavabos, fregaderos y  grifos.  Aparatos electrnicos tales como telfonos, controles remotos, teclados, computadoras y tabletas. Cmo proteger a los dems: Si tiene sntomas de COVID-19, tome medidas para evitar que el virus se propague a Producer, television/film/video.  Si cree que tiene una infeccin por COVID-19, comunquese de inmediato con su mdico. Informe al equipo de atencin mdica que cree que puede tener una infeccin por el COVID-19.  Qudese en su casa. Salga de su casa solo para buscar atencin mdica. No utilice el transporte pblico.  No viaje mientras est enfermo.  Lvese las manos frecuentemente con agua y [place]. Usar desinfectante para manos con alcohol si no dispone de [place] y [place].  Mantngase alejado de quienes vivan con usted. Permita que los miembros de la familia sanos cuiden a los nios y las [place], si es posible. Si tiene que cuidar a los nios o las mascotas, lvese las manos con frecuencia y use un barbijo. Si es posible, permanezca en su habitacin, separado de los dems. Utilice un  bao diferente.  Asegrese de que todas las personas que viven en su casa se laven bien las manos y con frecuencia.  Tosa o estornude en un pauelo de papel o sobre su manga o codo. No tosa o estornude al aire ni se cubra la boca o la nariz con la [place].  Use un barbijo de tela o una mascarilla facial. Asegrese de que le cubra la nariz y la boca. Dnde buscar ms informacin  Centers for Disease Control and Prevention (Centros para el Control y la Prevencin de Arboriculturist): PurpleGadgets.be  World Health Organization (Organizacin Mundial de la Salud): https://www.castaneda.info/ Comunquese con un mdico si:  Vive o ha viajado a una zona donde el COVID-19 es un riesgo y tiene sntomas de infeccin.  Ha tenido contacto con alguien que tiene COVID-19 y usted tiene sntomas de infeccin. Solicite ayuda inmediatamente si:  Tiene dificultad para respirar.  Siente dolor u opresin en el pecho.  Experimenta confusin.  [place] uas de los dedos y los labios de color [place].  Tiene dificultad para despertarse.  Los sntomas empeoran. Estos sntomas pueden representar un problema grave que constituye Engineer, maintenance (IT). No espere a ver si los sntomas desaparecen. Solicite atencin mdica de inmediato. Comunquese con el servicio de emergencias de su localidad (911 en los [place]). No conduzca por sus propios medios [employer] hospital. Informe al personal mdico de emergencias si cree que tiene COVID-19. Resumen  El COVID-19 es una infeccin respiratoria causada por un virus. Tambin se conoce como enfermedad por coronavirus o nuevo coronavirus. Puede causar infecciones graves, como neumona, sndrome de dificultad respiratoria aguda, insuficiencia respiratoria aguda o sepsis.  El virus que causa el COVID-19 es contagioso. Esto significa que puede transmitirse de [place] persona a otra a travs de las gotitas que se despiden al respirar, Electrical engineer,  cantar, toser y Brewing technologist.  Es ms probable que desarrolle una enfermedad grave si tiene 23 aos o ms, tiene el sistema inmunitario dbil, vive en un hogar de ancianos o tiene una enfermedad crnica.  No hay ningn medicamento para tratar el COVID-19. El mdico le informar sobre las maneras de tratar los sntomas.  Tome medidas para protegerse y Museum/gallery curator a los Location manager las infecciones. Lvese las manos con frecuencia y desinfecte los objetos y las superficies que se tocan con frecuencia todos [place]. Mantngase alejado de las personas que estn enfermas y use un barbijo si est enfermo. Esta informacin no tiene Marine scientist el consejo del  mdico. Asegrese de hacerle al mdico cualquier pregunta que tenga. Document Revised: 06/05/2019 Document Reviewed: 09/28/2018 Elsevier Patient Education  [place].  COVID-19: Cmo protegerse y proteger a los dems COVID-19: How to Protect Yourself and Others Sepa cmo se propaga  Actualmente, no existe ninguna vacuna para prevenir la enfermedad por coronavirus 2019 (COVID-19).  La mejor forma de prevenir la enfermedad es evitar exponerse a este virus.  Se cree que el virus se transmite principalmente de [place] persona a [place]. ? [employer] que estn en contacto directo entre s (a una distancia inferior a 6 pies [1.52m]). ? A travs de las gotitas respiratorias producidas cuando una persona infectada tose, estornuda o habla. ? Estas gotitas pueden caer en la boca o en la nariz de las personas que estn cerca o pueden ser [employer] pulmones. ? El COVID-19 puede ser transmitido por personas que no presentan sntomas. Lo que todos deben hacer [employer] las manos con frecuencia  Lvese las manos con frecuencia con agua y jabn durante al menos 20 segundos, especialmente despus de Investment banker, corporate en un lugar pblico o despus de sonarse la nariz, toser o estornudar.  Si no dispone de [place] y [place], use un desinfectante  de manos que contenga al menos un 60% de alcohol. [place] y frtelas hasta que se sientan secas.  No se toque los ojos, la nariz y la boca sin antes lavarse las manos. Evite el contacto cercano  Limite el contacto directo con otras personas tanto como sea posible.  Evite el contacto cercano con personas que estn enfermas.  Establezca distancia entre usted y [employer]. ? Recuerde que [employer] no tienen sntomas pueden transmitir el virus. ? Esto es Scientist, research (life sciences) para las personas que tienen ms riesgo de [email or web address].fr Cbrase la boca y la nariz con una mascarilla cuando est cerca de otras personas  Puede transmitir el COVID-19 a otras personas aunque no se sienta enfermo.  Todas las Proofreader en lugares pblicos y cuando estn con otras que no vivan en su casa, especialmente cuando el distanciamiento social sea difcil de [place]. ? Las mascarillas no deben colocarse a nios menores de 2 aos de [place], a las personas que tienen problemas respiratorios o que estn inconscientes, incapacitadas o que por algn motivo no puedan quitarse la mascarilla sin [place].  El propsito de Quarry manager es proteger a Producer, television/film/video en caso de que usted est infectado.  NO utilice las [place] [employer] a los trabajadores de [employer]	[place].  Contine manteniendo una distancia aproximada de 6 pies (1.51m) entre usted y [employer]. La mascarilla no reemplaza el distanciamiento social. Cbrase al toser y estornudar  Al toser o al estornudar, siempre cbrase la boca y la nariz con un pauelo descartable o use el interior del codo.  Deseche los pauelos descartables usados en la basura.  Inmediatamente, lvese las manos con agua y [place] durante al menos 20segundos. Si no dispone de agua y [place], [employer] con un desinfectante de  manos que contenga al menos un 60% de alcohol. Limpie y desinfecte  [place]pie Y desinfecte las superficies que se tocan con frecuencia todos los das. Esto incluye mesas, picaportes, interruptores de luz, encimeras, mangos, escritorios, telfonos, teclados, inodoros, grifos y lavabos. [email or web address]  Si las superficies estn sucias, lmpielas: Use detergente o jabn y agua antes de la desinfeccin.  Luego, use un desinfectante domstico. Physicist, medical de los desinfectantes domsticos  registrados en Conservation officer, historic buildings (EPA) (Agencia de Proteccin Ambiental) aqu. [email or web address] 04/16/2019 Esta informacin no tiene Marine scientist el consejo del mdico. Asegrese de hacerle al mdico cualquier pregunta que tenga. Document Revised: 04/30/2019 Document Reviewed: 02/21/2019 Elsevier Patient Education  [place].

## 2019-10-24 ENCOUNTER — Ambulatory Visit
Admission: RE | Admit: 2019-10-24 | Discharge: 2019-10-24 | Disposition: A | Discharge status: Home / Self Care | Payer: BC Managed Care – PPO | Source: Ambulatory Visit | Attending: Internal Medicine | Admitting: Internal Medicine

## 2019-10-24 ENCOUNTER — Other Ambulatory Visit: Payer: Self-pay

## 2019-10-24 ENCOUNTER — Ambulatory Visit
Admission: RE | Admit: 2019-10-24 | Discharge: 2019-10-24 | Disposition: A | Discharge status: Home / Self Care | Payer: BC Managed Care – PPO | Attending: Internal Medicine | Admitting: Internal Medicine

## 2019-10-24 ENCOUNTER — Other Ambulatory Visit: Payer: Self-pay | Admitting: Internal Medicine

## 2019-10-24 DIAGNOSIS — R059 Cough, unspecified: Secondary | ICD-10-CM

## 2019-10-24 DIAGNOSIS — R0602 Shortness of breath: Secondary | ICD-10-CM

## 2019-10-24 DIAGNOSIS — R05 Cough: Secondary | ICD-10-CM

## 2019-10-27 ENCOUNTER — Other Ambulatory Visit: Payer: Self-pay

## 2020-07-19 ENCOUNTER — Ambulatory Visit
Admission: RE | Admit: 2020-07-19 | Discharge: 2020-07-19 | Disposition: A | Discharge status: Home / Self Care | Payer: BC Managed Care – PPO | Source: Ambulatory Visit | Attending: Family Medicine | Admitting: Family Medicine

## 2020-07-19 ENCOUNTER — Other Ambulatory Visit: Payer: Self-pay

## 2020-07-19 ENCOUNTER — Ambulatory Visit
Admission: RE | Admit: 2020-07-19 | Discharge: 2020-07-19 | Disposition: A | Discharge status: Home / Self Care | Payer: BC Managed Care – PPO | Attending: Family Medicine | Admitting: Family Medicine

## 2020-07-19 ENCOUNTER — Other Ambulatory Visit: Payer: Self-pay | Admitting: Family Medicine

## 2020-07-19 DIAGNOSIS — R0602 Shortness of breath: Secondary | ICD-10-CM | POA: Diagnosis present

## 2020-07-19 DIAGNOSIS — R059 Cough, unspecified: Secondary | ICD-10-CM

## 2020-07-20 ENCOUNTER — Other Ambulatory Visit: Payer: Self-pay | Admitting: Family Medicine

## 2020-07-20 DIAGNOSIS — R918 Other nonspecific abnormal finding of lung field: Secondary | ICD-10-CM

## 2020-07-28 ENCOUNTER — Ambulatory Visit: Payer: BC Managed Care – PPO

## 2020-07-28 ENCOUNTER — Ambulatory Visit
Admission: RE | Admit: 2020-07-28 | Discharge: 2020-07-28 | Disposition: A | Discharge status: Home / Self Care | Payer: BC Managed Care – PPO | Source: Ambulatory Visit | Attending: Family Medicine | Admitting: Family Medicine

## 2020-07-28 ENCOUNTER — Other Ambulatory Visit: Payer: Self-pay

## 2020-07-28 DIAGNOSIS — R918 Other nonspecific abnormal finding of lung field: Secondary | ICD-10-CM | POA: Diagnosis not present

## 2020-07-28 MED ORDER — IOHEXOL 300 MG/ML  SOLN
75.0000 mL | Freq: Once | INTRAMUSCULAR | Status: AC | PRN
  Administered 2020-07-28: 75 mL via INTRAVENOUS

## 2020-08-16 ENCOUNTER — Ambulatory Visit: Payer: BC Managed Care – PPO

## 2020-09-22 ENCOUNTER — Encounter
Admission: RE | Admit: 2020-09-22 | Discharge: 2020-09-22 | Disposition: A | Discharge status: Home / Self Care | Payer: BC Managed Care – PPO | Source: Ambulatory Visit | Attending: Pulmonary Disease | Admitting: Pulmonary Disease

## 2020-09-22 HISTORY — DX: Type 2 diabetes mellitus without complications: E11.9

## 2020-09-23 ENCOUNTER — Encounter
Admission: RE | Admit: 2020-09-23 | Discharge: 2020-09-23 | Disposition: A | Discharge status: Home / Self Care | Payer: BC Managed Care – PPO | Source: Ambulatory Visit | Attending: Pulmonary Disease | Admitting: Pulmonary Disease

## 2020-09-23 ENCOUNTER — Other Ambulatory Visit: Payer: Self-pay

## 2020-09-23 HISTORY — DX: Unspecified asthma, uncomplicated: J45.909

## 2020-09-23 HISTORY — DX: Dyspnea, unspecified: R06.00

## 2020-09-23 NOTE — Patient Instructions (Addendum)
Your procedure is scheduled on: 10-01-20 FRIDAY Su procedimiento est programado para: Report to Registration Desk in the Medical Mall-Then proceed to the 2nd floor Surgery Desk Presntese a: To find out your arrival time please call [phone] between 1PM - 3PM on  09-30-20 THURSDAY Para saber su hora de llegada por favor llame al [phone] entre la 1PM - 3PM el da:   Remember: Instructions that are not followed completely may result in serious medical risk, up to and including death,  or upon the discretion of your surgeon and anesthesiologist your surgery may need to be rescheduled.  Recuerde: Las instrucciones que no se siguen completamente Heritage manager en un riesgo de salud grave, incluyendo hasta  la [place] o a discrecin de su cirujano y Environmental health practitioner, su ciruga se puede posponer.   __X_ 1.Do not eat food after midnight the night before your procedure. No    gum chewing or hard candies. You may drink clear liquids up to 2 hours     before you are scheduled to arrive for your surgery- DO not drink clear     Liquids within 2 hours of the start of your surgery.     Clear Liquids include:    water, apple juice without pulp, clear carbohydrate drink such as    Clearfast of Gartorade, Black Coffee or Tea (Do not add anything to coffee or tea).      No coma nada despus de la medianoche de la noche anterior a su    procedimiento. No coma chicles ni caramelos duros. Puede tomar    lquidos claros hasta 2 horas antes de su hora programada de llegada al     hospital para su procedimiento. No tome lquidos claros durante el     transcurso de las 2 horas de su llegada programada al hospital para su     procedimiento, ya que esto puede llevar a que su procedimiento se    retrase o tenga que volver a Health and safety inspector.  Los lquidos claros incluyen:          - Agua o jugo de [place] sin pulpa          - Bebidas claras con carbohidratos como ClearFast o Gatorade          - Caf negro o  t claro (sin leche, sin cremas, no agregue nada al caf ni al t)  No tome nada que no est en esta lista.  Los pacientes con diabetes tipo 1 y tipo 2 solo deben Agricultural engineer.  Llame a la clnica de PreCare o a la unidad de Same Day Surgery si  tiene alguna pregunta sobre estas instrucciones.              _X__ 2.Do Not Smoke or use e-cigarettes For 24 Hours Prior to Your Surgery.    Do not use any chewable tobacco products for at least 6   hours prior to surgery.    No fume ni use cigarrillos electrnicos durante las 24 horas previas    a su [place].  No use ningn producto de tabaco masticable durante   al menos 6 horas antes de la [place].     __X_ 3. No alcohol for 24 hours before or after surgery.    No tome alcohol durante las 24 horas antes ni despus de la [place].   ____4. Bring all medications with you on the day of surgery if instructed.    Lleve todos los medicamentos con usted el da de su  ciruga si se le    ha indicado as.   _X___ 5. Notify your doctor if there is any change in your medical condition (cold,fever, infections).    Informe a su mdico si hay algn cambio en su condicin mdica  (resfriado, fiebre, infecciones).   Do not wear jewelry, make-up, hairpins, clips or nail polish.  No use joyas, maquillajes, pinzas/ganchos para el cabello ni esmalte de uas.  Do not wear lotions, powders, or perfumes. You may wear deodorant.  No use lociones, polvos o perfumes.  Puede usar desodorante.    Do not shave 48 hours prior to surgery. Men may shave face and neck.  No se afeite 48 horas antes de la [place].  Los hombres pueden [employer] cara  y el cuello.   Do not bring valuables to the hospital.   No lleve objetos [place] is not responsible for any belongings or valuables.  [place] no se hace responsable de ningn tipo de pertenencias u objetos de Geographical information systems officer.               Contacts, dentures or bridgework may not be worn into  surgery.  Los lentes de [place], las dentaduras postizas o puentes no se pueden usar en la [place].   Leave your suitcase in the car. After surgery it may be brought to your room.  Deje su maleta en el auto.  Despus de la ciruga podr traerla a su habitacin.   For patients admitted to the hospital, discharge time is determined by your  treatment team.  Para los pacientes que sean ingresados al hospital, el tiempo en el cual se le  dar de alta es determinado por su equipo de [place].   Patients discharged the day of surgery will not be allowed to drive home. A los pacientes que se les da de alta el mismo da de la ciruga no se les permitir conducir a Holiday representative.   Please read over the following fact sheets that you were given: Por favor [place] informacin que le dieron:      _X___ Take these medicines the morning of surgery with A SIP OF WATER:          [employer] medicinas la maana de la ciruga con UN SORBO DE AGUA:  1. SYNTHROID (LEVOTHYROXINE)  2. PRILOSEC (OMEPRAZOLE)  3.   4.       5.  6.  _X___ Use inhalers on the day of surgery-USE YOUR ADVAIR AND ALBUTEROL INHALER-BRING YOUR ALBUTEROL INHALER TO [place]          Use los inhaladores el da de la ciruga  _X___ Stop metformin 2 days prior to surger-LAST DOSE ON TUESDAY 09-28-20          Deje de tomar el metformin 2 das antes de la [place]     _X___ Stop Anti-inflammatories NOW-STOP DICLOFENAC (VOLTAREN) NOW-ONLY TAKE TYLENOL/TRAMADOL IF NEEDED          Deje de tomar antiinflamatorios el da:   _X___ Stop supplements until after surgery-OK TO CONTINUE YOUR MULTIVITAMIN AND VITAMIN D UP UNTIL THE DAY BEFORE YOUR SURGERY           Deje de tomar suplementos hasta despus de la [place]

## 2020-09-28 ENCOUNTER — Other Ambulatory Visit: Payer: Self-pay

## 2020-09-28 ENCOUNTER — Encounter
Admission: RE | Admit: 2020-09-28 | Discharge: 2020-09-28 | Disposition: A | Discharge status: Home / Self Care | Payer: BC Managed Care – PPO | Source: Ambulatory Visit | Attending: Pulmonary Disease | Admitting: Pulmonary Disease

## 2020-09-28 DIAGNOSIS — E119 Type 2 diabetes mellitus without complications: Secondary | ICD-10-CM | POA: Insufficient documentation

## 2020-09-28 DIAGNOSIS — Z8616 Personal history of COVID-19: Secondary | ICD-10-CM | POA: Diagnosis not present

## 2020-09-28 DIAGNOSIS — R590 Localized enlarged lymph nodes: Secondary | ICD-10-CM | POA: Insufficient documentation

## 2020-09-28 DIAGNOSIS — Z20822 Contact with and (suspected) exposure to covid-19: Secondary | ICD-10-CM | POA: Diagnosis not present

## 2020-09-28 DIAGNOSIS — D860 Sarcoidosis of lung: Secondary | ICD-10-CM | POA: Diagnosis not present

## 2020-09-28 DIAGNOSIS — Z01818 Encounter for other preprocedural examination: Secondary | ICD-10-CM | POA: Insufficient documentation

## 2020-09-28 LAB — CBC
HCT: 44.3 % (ref 39.0–52.0)
Hemoglobin: 15.6 g/dL (ref 13.0–17.0)
MCH: 31.2 pg (ref 26.0–34.0)
MCHC: 35.2 g/dL (ref 30.0–36.0)
MCV: 88.6 fL (ref 80.0–100.0)
Platelets: 277 10*3/uL (ref 150–400)
RBC: 5 MIL/uL (ref 4.22–5.81)
RDW: 12.9 % (ref 11.5–15.5)
WBC: 8.7 10*3/uL (ref 4.0–10.5)
nRBC: 0 % (ref 0.0–0.2)

## 2020-09-28 LAB — BASIC METABOLIC PANEL
Anion gap: 10 (ref 5–15)
BUN: 25 mg/dL — ABNORMAL HIGH (ref 8–23)
CO2: 24 mmol/L (ref 22–32)
Calcium: 9.2 mg/dL (ref 8.9–10.3)
Chloride: 102 mmol/L (ref 98–111)
Creatinine, Ser: 0.91 mg/dL (ref 0.61–1.24)
GFR, Estimated: >60 mL/min (ref >60)
Glucose, Bld: 104 mg/dL — ABNORMAL HIGH (ref 70–99)
Potassium: 3.5 mmol/L (ref 3.5–5.1)
Sodium: 136 mmol/L (ref 135–145)

## 2020-09-28 LAB — APTT: aPTT: 29 seconds (ref 24–36)

## 2020-09-28 LAB — PROTIME-INR
INR: 1.1 (ref 0.8–1.2)
Prothrombin Time: 13.9 seconds (ref 11.4–15.2)

## 2020-09-29 ENCOUNTER — Other Ambulatory Visit
Admission: RE | Admit: 2020-09-29 | Discharge: 2020-09-29 | Disposition: A | Discharge status: Home / Self Care | Payer: BC Managed Care – PPO | Source: Ambulatory Visit | Attending: Pulmonary Disease | Admitting: Pulmonary Disease

## 2020-09-29 DIAGNOSIS — Z20822 Contact with and (suspected) exposure to covid-19: Secondary | ICD-10-CM | POA: Insufficient documentation

## 2020-09-29 DIAGNOSIS — Z01812 Encounter for preprocedural laboratory examination: Secondary | ICD-10-CM | POA: Insufficient documentation

## 2020-09-29 DIAGNOSIS — R590 Localized enlarged lymph nodes: Secondary | ICD-10-CM | POA: Diagnosis not present

## 2020-09-30 LAB — SARS CORONAVIRUS 2 (TAT 6-24 HRS): SARS Coronavirus 2: NEGATIVE

## 2020-09-30 MED ORDER — ORAL CARE MOUTH RINSE: 15.0000 mL | Freq: Once | OROMUCOSAL | Status: AC

## 2020-09-30 MED ORDER — CHLORHEXIDINE GLUCONATE 0.12 % MT SOLN: 15.0000 mL | Freq: Once | OROMUCOSAL | Status: AC

## 2020-09-30 MED ORDER — SODIUM CHLORIDE 0.9 % IV SOLN: INTRAVENOUS | Status: DC

## 2020-10-01 ENCOUNTER — Encounter
Admission: RE | Disposition: A | Discharge status: Home Health | Payer: Self-pay | Source: Home / Self Care | Attending: Pulmonary Disease

## 2020-10-01 ENCOUNTER — Encounter: Payer: Self-pay | Admitting: *Deleted

## 2020-10-01 ENCOUNTER — Ambulatory Visit
Admission: RE | Admit: 2020-10-01 | Discharge: 2020-10-01 | Disposition: A | Discharge status: Home Health | Payer: BC Managed Care – PPO | Attending: Pulmonary Disease | Admitting: Pulmonary Disease

## 2020-10-01 ENCOUNTER — Other Ambulatory Visit: Payer: Self-pay

## 2020-10-01 ENCOUNTER — Ambulatory Visit: Payer: BC Managed Care – PPO | Admitting: Registered Nurse

## 2020-10-01 DIAGNOSIS — E119 Type 2 diabetes mellitus without complications: Secondary | ICD-10-CM | POA: Insufficient documentation

## 2020-10-01 DIAGNOSIS — D860 Sarcoidosis of lung: Secondary | ICD-10-CM | POA: Insufficient documentation

## 2020-10-01 DIAGNOSIS — R590 Localized enlarged lymph nodes: Secondary | ICD-10-CM | POA: Insufficient documentation

## 2020-10-01 DIAGNOSIS — Z20822 Contact with and (suspected) exposure to covid-19: Secondary | ICD-10-CM | POA: Insufficient documentation

## 2020-10-01 DIAGNOSIS — Z8616 Personal history of COVID-19: Secondary | ICD-10-CM | POA: Insufficient documentation

## 2020-10-01 HISTORY — PX: VIDEO BRONCHOSCOPY WITH ENDOBRONCHIAL ULTRASOUND: SHX6177

## 2020-10-01 HISTORY — PX: VIDEO BRONCHOSCOPY WITH ENDOBRONCHIAL NAVIGATION: SHX6175

## 2020-10-01 LAB — GLUCOSE, CAPILLARY
Glucose-Capillary: 114 mg/dL — ABNORMAL HIGH (ref 70–99)
Glucose-Capillary: 98 mg/dL (ref 70–99)

## 2020-10-01 SURGERY — BRONCHOSCOPY, WITH EBUS
Anesthesia: General

## 2020-10-01 MED ORDER — LIDOCAINE HCL (PF) 2 % IJ SOLN
INTRAMUSCULAR | Status: AC
  Filled 2020-10-01: qty 5

## 2020-10-01 MED ORDER — SUCCINYLCHOLINE CHLORIDE 20 MG/ML IJ SOLN
INTRAMUSCULAR | Status: DC | PRN
  Administered 2020-10-01: 120 mg via INTRAVENOUS

## 2020-10-01 MED ORDER — CHLORHEXIDINE GLUCONATE 0.12 % MT SOLN
OROMUCOSAL | Status: AC
  Administered 2020-10-01: 15 mL via OROMUCOSAL
  Filled 2020-10-01: qty 15

## 2020-10-01 MED ORDER — ROCURONIUM BROMIDE 10 MG/ML (PF) SYRINGE
PREFILLED_SYRINGE | INTRAVENOUS | Status: AC
  Filled 2020-10-01: qty 10

## 2020-10-01 MED ORDER — LIDOCAINE HCL URETHRAL/MUCOSAL 2 % EX GEL
1.0000 "application " | Freq: Once | CUTANEOUS | Status: DC
  Filled 2020-10-01: qty 5

## 2020-10-01 MED ORDER — FENTANYL CITRATE (PF) 100 MCG/2ML IJ SOLN: 25.0000 ug | INTRAMUSCULAR | Status: DC | PRN

## 2020-10-01 MED ORDER — LIDOCAINE HCL (CARDIAC) PF 100 MG/5ML IV SOSY
PREFILLED_SYRINGE | INTRAVENOUS | Status: DC | PRN
  Administered 2020-10-01: 100 mg via INTRAVENOUS

## 2020-10-01 MED ORDER — DEXAMETHASONE SODIUM PHOSPHATE 10 MG/ML IJ SOLN
INTRAMUSCULAR | Status: DC | PRN
  Administered 2020-10-01: 10 mg via INTRAVENOUS

## 2020-10-01 MED ORDER — MIDAZOLAM HCL 2 MG/2ML IJ SOLN
INTRAMUSCULAR | Status: DC | PRN
  Administered 2020-10-01: 2 mg via INTRAVENOUS

## 2020-10-01 MED ORDER — FENTANYL CITRATE (PF) 100 MCG/2ML IJ SOLN
INTRAMUSCULAR | Status: DC | PRN
  Administered 2020-10-01: 50 ug via INTRAVENOUS
  Administered 2020-10-01 (×2): 25 ug via INTRAVENOUS

## 2020-10-01 MED ORDER — PROPOFOL 10 MG/ML IV BOLUS
INTRAVENOUS | Status: AC
  Filled 2020-10-01: qty 20

## 2020-10-01 MED ORDER — ONDANSETRON HCL 4 MG/2ML IJ SOLN
INTRAMUSCULAR | Status: AC
  Filled 2020-10-01: qty 2

## 2020-10-01 MED ORDER — SUCCINYLCHOLINE CHLORIDE 200 MG/10ML IV SOSY
PREFILLED_SYRINGE | INTRAVENOUS | Status: AC
  Filled 2020-10-01: qty 10

## 2020-10-01 MED ORDER — MIDAZOLAM HCL 2 MG/2ML IJ SOLN
INTRAMUSCULAR | Status: AC
  Filled 2020-10-01: qty 2

## 2020-10-01 MED ORDER — PROPOFOL 10 MG/ML IV BOLUS
INTRAVENOUS | Status: DC | PRN
  Administered 2020-10-01: 140 mg via INTRAVENOUS

## 2020-10-01 MED ORDER — ONDANSETRON HCL 4 MG/2ML IJ SOLN: 4.0000 mg | Freq: Once | INTRAMUSCULAR | Status: DC | PRN

## 2020-10-01 MED ORDER — LIDOCAINE HCL 4 % MT SOLN
OROMUCOSAL | Status: DC | PRN
  Administered 2020-10-01: 4 mL via TOPICAL

## 2020-10-01 MED ORDER — FENTANYL CITRATE (PF) 100 MCG/2ML IJ SOLN
INTRAMUSCULAR | Status: AC
  Filled 2020-10-01: qty 2

## 2020-10-01 MED ORDER — PHENYLEPHRINE HCL 0.25 % NA SOLN
1.0000 | Freq: Four times a day (QID) | NASAL | Status: DC | PRN
  Filled 2020-10-01: qty 15

## 2020-10-01 MED ORDER — BUTAMBEN-TETRACAINE-BENZOCAINE 2-2-14 % EX AERO
1.0000 | INHALATION_SPRAY | Freq: Once | CUTANEOUS | Status: DC
  Filled 2020-10-01: qty 20

## 2020-10-01 MED ORDER — DEXAMETHASONE SODIUM PHOSPHATE 10 MG/ML IJ SOLN
INTRAMUSCULAR | Status: AC
  Filled 2020-10-01: qty 1

## 2020-10-01 MED ORDER — ROCURONIUM BROMIDE 100 MG/10ML IV SOLN
INTRAVENOUS | Status: DC | PRN
  Administered 2020-10-01: 5 mg via INTRAVENOUS
  Administered 2020-10-01: 35 mg via INTRAVENOUS

## 2020-10-01 MED ORDER — SUGAMMADEX SODIUM 200 MG/2ML IV SOLN
INTRAVENOUS | Status: DC | PRN
  Administered 2020-10-01: 200 mg via INTRAVENOUS

## 2020-10-01 MED ORDER — EPHEDRINE 5 MG/ML INJ
INTRAVENOUS | Status: AC
  Filled 2020-10-01: qty 10

## 2020-10-01 MED ORDER — LIDOCAINE HCL (PF) 1 % IJ SOLN
30.0000 mL | Freq: Once | INTRAMUSCULAR | Status: DC
  Filled 2020-10-01: qty 30

## 2020-10-01 MED ORDER — ONDANSETRON HCL 4 MG/2ML IJ SOLN
INTRAMUSCULAR | Status: DC | PRN
  Administered 2020-10-01: 4 mg via INTRAVENOUS

## 2020-10-01 MED ORDER — PHENYLEPHRINE HCL (PRESSORS) 10 MG/ML IV SOLN
INTRAVENOUS | Status: DC | PRN
  Administered 2020-10-01 (×2): 100 ug via INTRAVENOUS

## 2020-10-01 NOTE — Procedures (Signed)
FIBEROPTIC BRONCHOSCOPY WITH AIRWAY INSPECTION PROCEDURE NOTE  ENDOBRONCHIAL ULTRASOUND PROCEDURE NOTE    Flexible bronchoscopy was performed  by : Lanney Gins MD  assistance by : 1)Repiratory therapist  and 2)[employer] staff WITH PATHOLOGIST 3) Anesthesia team Indication for the procedure was :  Pre-procedural H&P. The following assessment was performed on the day of the procedure prior to initiating sedation History:  Chest pain n Dyspnea y Hemoptysis n Cough y Fever n Other pertinent items n  Examination Vital signs -reviewed as per nursing documentation today Cardiac    Murmurs: n  Rubs : n  Gallop: n Lungs Wheezing: n Rales : n Rhonchi :y  Other pertinent findings: SOB/hypoxemia due to chronic lung disease   Pre-procedural assessment for Procedural Sedation included: Depth of sedation: As per anesthesia team  ASA Classification:  2 Mallampati airway assessment: 3    Medication list reviewed: y  The patient's interval history was taken and revealed: no new complaints The pre- procedure physical examination revealed: No new findings Refer to prior clinic note for details.  Informed Consent: Informed consent was obtained from:  patient after explanation of procedure and risks, benefits, as well as alternative procedures available.  Explanation of level of sedation and possible transfusion was also provided.    Procedural Preparation: Time out was performed and patient was identified by name and birthdate and procedure to be performed and side for sampling, if any, was specified. Pt was intubated by anesthesia.  The patient was appropriately draped.   Fiberoptic bronchoscopy with airway inspection Bronchoscope was inserted via ETT  without difficulty.  Posterior oropharynx, epiglottis, arytenoids, false cords and vocal cords were not visualized as these were bypassed by endotracheal tube. The distal trachea was normal in circumference and appearance  without mucosal, cartilaginous or branching abnormalities.  The main carina was mildly splayed . All right and left lobar airways were visualized to the Subsegmental level.  Sub- sub segmental carinae were identified in all the distal airways.   Secretions were visible in the following airways and appeared to be clear.  The mucosa was : Left mainstem bronchus cartilagenous mucosal abnormality noted  Media Information         Document Information  Photos    10/01/2020 12:10  Attached To:  Hospital Encounter on 10/01/20   Source Information  Andrew Glazier, MD  [facility]    Airways were notable for:        exophytic lesions :n       extrinsic compression in the following distributions: n.       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 Examination began to evaluate for pathologically enlarged lymph nodes starting on the Left  side progressing to the right side.  All lymph node biopsies performed with 21G needle. Lymph node biopsies were sent in cytolite for all stations.  Station 10L - 1.3cm - 3 pasess - good lymphoid shedding - +histiocytes and granulomatous lesion Station 7 - 2.4Cm - 4 passess - good lymphoid shedding -  Station 10R- 1.3cm 3 passess - lymphocytes and granulomatous findings - 2 biopsy specmens were also placed in RPMI for lymphoproliferative workup   Post procedure diagnosis:  Pulmonary sarcoidosis   Specimens  obtained included:  Fluoroscopy Used: no;        Pictorial documentation attached: yes                  Immediate sampling complications included:none  Epinephrine zero ml was used topically  The bronchoscopy was terminated due to completion of  the planned procedure and the bronchoscope was removed.   Total dosage of Lidocaine was zero mg Total fluoroscopy time was zero  minutes   Estimated Blood loss: 2cc.    Disposition: home with wife Andrew Elliott - I met with wife and reviewed case findings with follow up set up in 1wk with Dr Raul Del in [place] MD  [place] [facility] [place]

## 2020-10-01 NOTE — Anesthesia Preprocedure Evaluation (Signed)
Anesthesia Evaluation  Patient identified by MRN, date of birth, ID band Patient awake    Reviewed: Allergy & Precautions, H&P , NPO status , Patient's Chart, lab work & pertinent test results, reviewed documented beta blocker date and time   Airway Mallampati: II   Neck ROM: full    Dental  (+) Poor Dentition   Pulmonary neg pulmonary ROS, shortness of breath and with exertion, asthma , pneumonia, resolved,    Pulmonary exam normal        Cardiovascular Exercise Tolerance: Good negative cardio ROS Normal cardiovascular exam Rhythm:regular Rate:Normal     Neuro/Psych negative neurological ROS  negative psych ROS   GI/Hepatic Neg liver ROS, GERD  ,  Endo/Other  diabetes, Well ControlledHypothyroidism   Renal/GU negative Renal ROS  negative genitourinary   Musculoskeletal   Abdominal   Peds  Hematology negative hematology ROS (+)   Anesthesia Other Findings Past Medical History: No date: Asthma 09/2019: COVID-19 No date: Diabetes mellitus without complication ([place]) No date: Dyspnea     Comment:  with exertion No date: GERD (gastroesophageal reflux disease) 2013: Hypothyroid 2021: Pneumonia Past Surgical History: 10/19/2016: COLONOSCOPY WITH PROPOFOL; N/A     Comment:  Procedure: COLONOSCOPY WITH PROPOFOL;  Surgeon: Lollie Sails, MD;  Location: [facility] ENDOSCOPY;  Service:               Endoscopy;  Laterality: N/A; 01/23/2018: DISTAL BICEPS TENDON REPAIR; Left     Comment:  Procedure: PRIMARY DISTAL BICEPS TENDON REPAIR TENDON               RUPTURE;  Surgeon: Corky Mull, MD;  Location: [facility];  Service: Orthopedics;  Laterality: Left;                C ARM OR FLUOROSCAN UNIT BIOMET TOGGLE LOK DEVICE 10/19/2016: ESOPHAGOGASTRODUODENOSCOPY (EGD) WITH PROPOFOL; N/A     Comment:  Procedure: ESOPHAGOGASTRODUODENOSCOPY (EGD) WITH               PROPOFOL;  Surgeon: Lollie Sails, MD;  Location[facility]               [facility] END[facility]COPY;  Service: Endoscopy;  Laterality: N/A; BMI    Body Mass Index: 31.32 kg/m     Reproductive/Obstetrics negative OB ROS                             Anesthesia Physical Anesthesia Plan  ASA: III  Anesthesia Plan: General   Post-op Pain Management:    Induction:   PONV Risk Score and Plan:   Airway Management Planned:   Additional Equipment:   Intra-op Plan:   Post-operative Plan:   Informed Consent: I have reviewed the patients History and Physical, chart, labs and discussed the procedure including the risks, benefits and alternatives for the proposed anesthesia with the patient or authorized representative who has indicated his/her understanding and acceptance.     Dental Advisory Given  Plan Discussed with: CRNA  Anesthesia Plan Comments:         Anesthesia Quick Evaluation

## 2020-10-01 NOTE — Transfer of Care (Signed)
Immediate Anesthesia Transfer of Care Note  Patient: [place]  Procedure(s) Performed: VIDEO BRONCHOSCOPY WITH ENDOBRONCHIAL ULTRASOUND (N/A ) VIDEO BRONCHOSCOPY WITH ENDOBRONCHIAL NAVIGATION (N/A )  Patient Location: PACU  Anesthesia Type:General  Level of Consciousness: awake, alert  and oriented  Airway & Oxygen Therapy: Patient Spontanous Breathing and Patient connected to face mask oxygen  Post-op Assessment: Report given to RN and Post -op Vital signs reviewed and stable  Post vital signs: Reviewed and stable  Last Vitals:  Vitals Value Taken Time  BP 121/85 10/01/20 1255  Temp    Pulse 63 10/01/20 1257  Resp 29 10/01/20 1257  SpO2 100 % 10/01/20 1257  Vitals shown include unvalidated device data.  Last Pain:  Vitals:   10/01/20 1142  TempSrc: Temporal  PainSc: 0-No pain      Patients Stated Pain Goal: 0 (79/43/[id number])  Complications: No complications documented.

## 2020-10-01 NOTE — Anesthesia Procedure Notes (Signed)
Procedure Name: Intubation Date/Time: 10/01/2020 12:06 PM Performed by: Hedda Slade, CRNA Pre-anesthesia Checklist: Patient identified, Patient being monitored, Timeout performed, Emergency Drugs available and Suction available Patient Re-evaluated:Patient Re-evaluated prior to induction Oxygen Delivery Method: Circle system utilized Preoxygenation: Pre-oxygenation with 100% oxygen Induction Type: IV induction Ventilation: Mask ventilation without difficulty Laryngoscope Size: McGraph and 4 Grade View: Grade I Tube type: Oral Tube size: 9.0 mm Number of attempts: 1 Airway Equipment and Method: Stylet,  Video-laryngoscopy and LTA kit utilized Placement Confirmation: ETT inserted through vocal cords under direct vision,  positive ETCO2 and breath sounds checked- equal and bilateral Secured at: 22 cm Tube secured with: Tape Dental Injury: Teeth and Oropharynx as per pre-operative assessment

## 2020-10-01 NOTE — Discharge Instructions (Addendum)
Flexible Bronchoscopy  Flexible bronchoscopy is a procedure used to examine the passageways in the lungs. During the procedure, a thin, flexible tool with a camera (bronchoscope) is passed into the mouth or nose, down through the windpipe (trachea), and into the air tubes in the lungs (bronchi). This tool allows the health care provider to look inside the lungs and to take samples for testing, if needed. Tell a health care provider about:  Any allergies you have.  All medicines you are taking, including vitamins, herbs, eye drops, creams, and over-the-counter medicines.  Any problems you or family members have had with anesthetic medicines.  Any blood disorders you have.  Any surgeries you have had.  Any medical conditions you have.  Whether you are pregnant or may be pregnant. What are the risks? Generally, this is a safe procedure. However, problems may occur, including:  Infection.  Bleeding.  Damage to other structures or organs.  Allergic reactions to medicines.  Collapsed lung (pneumothorax).  Increased need for oxygen or difficulty breathing after the procedure. What happens before the procedure? Staying hydrated Follow instructions from your health care provider about hydration, which may include:  Up to 2 hours before the procedure - you may continue to drink clear liquids, such as water, clear fruit juice, black coffee, and plain tea.   Eating and drinking restrictions Follow instructions from your health care provider about eating and drinking, which may include:  8 hours before the procedure - stop eating heavy meals or foods, such as meat, fried foods, or fatty foods.  6 hours before the procedure - stop eating light meals or foods, such as toast or cereal.  6 hours before the procedure - stop drinking milk or drinks that contain milk.  2 hours before the procedure - stop drinking clear liquids. Medicines Ask your health care provider about:  Changing or  stopping your regular medicines. This is especially important if you are taking diabetes medicines or blood thinners.  Taking medicines such as aspirin and ibuprofen. These medicines can thin your blood. Do not take these medicines unless your health care provider tells you to take them.  Taking over-the-counter medicines, vitamins, herbs, and supplements. General instructions  You may be given antibiotic medicine to help lower the risk of infection.  Plan to have a responsible adult take you home from the hospital or clinic.  If you will be going home right after the procedure, plan to have a responsible adult care for you for the time you are told. This is important. What happens during the procedure?  An IV will be inserted into one of your veins.  You will be given a medicine (local anesthetic) to numb your mouth, nose, throat, and voice box (larynx). You may also be given one or more of the following: ? A medicine to help you relax (sedative). ? A medicine to control coughing. ? A medicine to dry up any fluids or secretions in your lungs.  A bronchoscope will be passed into your nose or mouth, and into your lungs. Your health care provider will examine your lungs.  Samples of airway secretions may be collected for testing.  If abnormal areas are seen in your airways, samples of tissue may be removed and checked under a microscope (biopsy).  If tissue samples are needed from the outer parts of the lung, a type of X-ray (fluoroscopy) may be used to guide the bronchoscope to these areas.  If bleeding occurs, you may be given medicine to  stop or decrease the bleeding. The procedure may vary among health care providers and hospitals. What can I expect after the procedure?  Your blood pressure, heart rate, breathing rate, and blood oxygen level will be monitored until you leave the hospital or clinic.  You may have a chest X-ray to check for signs of pneumothorax.  You willnot be  allowed to eat or drink anything for 2 hours after your procedure.  If a biopsy was taken, it is up to you to get the results of the test. Ask your health care provider, or the department that is doing the procedure, when your results will be ready.  You may have the following symptoms for 24-48 hours: ? A cough that is worse than it was before the procedure. ? A low-grade fever. ? A sore throat or hoarse voice. ? Some blood in the mucus from your lungs (sputum), if a biopsy was done. Follow these instructions at home: Eating and drinking  Do not eat or drink anything, including water, for 2 hours after your procedure, or until your numbing medicine has worn off. Having a numb throat increases your risk of burning yourself or choking.  Start eating soft foods and slowly drinking liquids after your numbness is gone and your cough and gag reflexes have returned.  You may return to your normal diet the day after the procedure. Driving  If you were given a sedative during the procedure, it can affect you for several hours. Do not drive or operate machinery until your health care provider says that it is safe.  Ask your health care provider if the medicine prescribed to you requires you to avoid driving or using machinery.  Return to your normal activities as told by your health care provider. Ask your health care provider what activities are safe for you. General instructions  Take over-the-counter and prescription medicines only as told by your health care provider.  Do not use any products that contain nicotine or tobacco. These products include cigarettes, chewing tobacco, and vaping devices, such as e-cigarettes. If you need help quitting, ask your health care provider.  Keep all follow-up visits. This is important.   Get help right away if:  You have shortness of breath that gets worse.  You become light-headed or feel like you might faint.  You have chest pain.  You cough up  more than a small amount of blood. These symptoms may represent a serious problem that is an emergency. Do not wait to see if the symptoms will go away. Get medical help right away. Call your local emergency services (911 in the [place] Do not drive yourself to the hospital. Summary  Flexible bronchoscopy is a procedure that allows your health care provider to look closely inside your lungs and to take testing samples if needed.  Risks of flexible bronchoscopy include bleeding, infection, and collapsed lung (pneumothorax).  Before the procedure, you will be given a medicine to numb your mouth, nose, throat, and voice box. Then, a bronchoscope will be passed into your nose or mouth, and into your lungs.  After the procedure, your blood pressure, heart rate, breathing rate, and blood oxygen level will be monitored until you leave the hospital or clinic. You may have a chest X-ray to check for signs of pneumothorax.  You will not be allowed to eat or drink anything for 2 hours after your procedure. This information is not intended to replace advice given to you by your health care provider.  Make sure you discuss any questions you have with your health care provider. Document Revised: 02/19/2020 Document Reviewed: 02/19/2020 Elsevier Patient Education  2021 [place] flexible Flexible Bronchoscopy  La broncoscopia flexible es un procedimiento utilizado para Passenger transport manager las vas areas de los pulmones. Durante el procedimiento, se introduce un instrumento delgado y flexible con una cmara (broncoscopio) en la boca o nariz, se lo gua por la trquea Librarian, academic a los conductos que transportan el aire a los pulmones (bronquios). Este instrumento permite al mdico examinar el interior de los pulmones y obtener muestras para analizarlas, si fuera necesario. Informe al mdico acerca de lo siguiente:  Cualquier alergia que tenga.  Todos los [employer] [place], incluidos vitaminas,  hierbas, gotas oftlmicas, cremas y medicamentos de venta libre.  Problemas previos que usted o algn miembro de su familia hayan tenido con los anestsicos.  Cualquier trastorno de la sangre que tenga.  Cirugas a las que se haya sometido.  Cualquier afeccin mdica que tenga.  Si est embarazada o podra estarlo. Cules son los riesgos? En general, se trata de un procedimiento seguro. Sin embargo, pueden ocurrir complicaciones, por ejemplo:  Infeccin.  Sangrado.  Daos a otras estructuras u rganos.  Reacciones alrgicas a los medicamentos.  Pulmn colapsado (neumotrax).  Mayor necesidad de oxgeno o dificultad para respirar despus del procedimiento. Qu ocurre antes del procedimiento? Mantenerse hidratado Siga las instrucciones del mdico acerca de mantenerse hidratado, las cuales pueden incluir lo siguiente:  [employer] horas antes del procedimiento, puede beber lquidos transparentes, como agua, jugos de fruta sin pulpa, caf negro y t solo.   Restricciones en las comidas y bebidas Siga las instrucciones del mdico respecto de las restricciones de comidas o bebidas, las cuales pueden incluir lo siguiente:  Ocho horas antes del procedimiento, deje de ingerir comidas o alimentos pesados, como carne, alimentos fritos o alimentos grasos.  Seis horas antes del procedimiento, deje de ingerir comidas o alimentos livianos, como tostadas o cereales.  Seis horas antes del procedimiento, deje de beber [place] o bebidas que [employer].  Dos horas antes del procedimiento, deje de beber lquidos transparentes. Medicamentos Consulte al mdico sobre:  Quarry manager o suspender los medicamentos que [place] habitualmente. Esto es muy importante si toma medicamentos para la diabetes o anticoagulantes.  Tomar medicamentos como aspirina e ibuprofeno. Estos medicamentos pueden tener un efecto anticoagulante en la [place]. No tome estos medicamentos a menos que el mdico se lo  indique.  Usar medicamentos de venta libre, vitaminas, hierbas y suplementos. Indicaciones generales  Le podrn administrar antibiticos para ayudar a reducir el riesgo de infeccin.  Haga que un adulto responsable lo lleve a su casa desde el hospital o la clnica.  Si va a marcharse a su casa inmediatamente despus del procedimiento, pdale a un adulto responsable que lo cuide durante el tiempo que le indiquen. Esto es importante. Qu ocurre durante el procedimiento?  Le colocarn una va intravenosa (i.v.) en una vena.  Se le administrar un medicamento (anestesia local) para adormecer la boca, la nariz, la garganta y la laringe. Pueden administrarle uno o ms de los siguientes medicamentos: ? Un medicamento para ayudar a relajarse (sedante). ? Un medicamento para controlar la tos. ? Un medicamento para secar completamente los lquidos o secreciones de los pulmones.  Le introducirn un broncoscopio por la nariz o la boca [employer]. [place] [employer].  Quizs se obtengan muestras de las secreciones de las vas respiratorias para Optometrist  estudios.  Si se observan zonas anmalas en las vas respiratorias, es posible que se tomen muestras de tejido y se las examine con un microscopio (biopsia).  Si fuera necesario tomar muestras de tejido de las porciones externas del pulmn, es posible que se use un tipo de Runner, broadcasting/film/video (fluoroscopa) para guiar el broncoscopio hasta estas zonas.  Si hay hemorragia, quizs se utilice un medicamento para Scientist, water quality o Engineer, maintenance. El procedimiento puede variar segn el mdico y el hospital. Qu puedo esperar despus del procedimiento?  Le controlarn la presin arterial, la frecuencia cardaca, la frecuencia respiratoria y Retail buyer de oxgeno en la sangre hasta que le den el alta del hospital o la clnica.  Es posible que le hagan una radiografa de trax para detectar la presencia de un neumotrax.  No le  permitirn comer ni beber nada durante las 2 horas posteriores al procedimiento.  Si le hicieron [employer], es su responsabilidad retirar los [place]. Pregntele a su mdico, o a un miembro del personal del departamento donde se realice el procedimiento, cundo estarn [employer].  Puede tener los siguientes sntomas durante 24 a 48 horas: ? Una tos peor que la que tena antes del procedimiento. ? Fiebre no muy alta. ? Dolor de garganta o voz ronca. ? Algo de [employer] mucosidad de los pulmones (esputo), si se Gwyneth Revels biopsia. Siga estas instrucciones en su casa: Comida y bebida  No coma ni beba nada, incluso agua, durante las 2 horas posteriores al procedimiento, o hasta que desaparezca el efecto del medicamento anestsico. La anestesia de la garganta aumenta el riesgo de quemarse o ahogarse.  Comience a comer alimentos blandos y a beber lquido lentamente despus de que haya desaparecido el adormecimiento y hayan regresado los reflejos farngeos y de tos.  El da despus del procedimiento puede retomar su dieta normal. Conducir  Si le administraron un sedante durante el procedimiento, puede afectarlo por varias horas. No conduzca ni opere maquinaria hasta que el mdico le indique que es seguro [place].  Pregntele al mdico si el medicamento recetado le impide conducir o usar [place].  Retome sus actividades normales segn lo indicado por el mdico. Pregntele al mdico qu actividades son seguras para usted. Indicaciones generales  Use los medicamentos de venta libre y los recetados solamente como se lo haya indicado el mdico.  No consuma ningn producto que contenga nicotina o tabaco. Estos productos incluyen cigarrillos, tabaco para Higher education careers adviser y aparatos de vapeo, como los Psychologist, sport and exercise. Si necesita ayuda para dejar de consumir estos productos, consulte al [employer].  Cumpla con todas las visitas de seguimiento. Esto es importante.    Solicite ayuda de inmediato si:  La falta de aire empeora.  Se siente mareado o como si se fuera a desmayar.  Siente dolor en el pecho.  Cuando tose, expulsa ms que una pequea cantidad de [place]. Estos sntomas pueden representar un problema grave que constituye Engineer, maintenance (IT). No espere a ver si los sntomas desaparecen. Solicite atencin mdica de inmediato. Comunquese con el servicio de emergencias de su localidad (911 en los [place]). No conduzca por sus propios medios [employer] hospital. Resumen  La broncoscopia flexible es un procedimiento que le permite al mdico examinar detenidamente el interior de los pulmones y obtener muestras para analizarlas, si fuera necesario.  Algunos riesgos de la broncoscopia flexible son hemorragia, infeccin y pulmn colapsado (neumotrax).  Antes del procedimiento, se Hotel manager un medicamento para adormecer la boca, la [place],  la garganta y la laringe. Luego, le introducirn un broncoscopio por la Lawyer o la boca [employer].  Despus del procedimiento, le controlarn la presin arterial, la frecuencia cardaca, la frecuencia respiratoria y Retail buyer de oxgeno en la sangre hasta que le den el alta del hospital o la clnica. Es posible que le hagan una radiografa de trax para detectar la presencia de un neumotrax.  No le permitirn comer ni beber nada durante las 2 horas posteriores al procedimiento. Esta informacin no tiene Marine scientist el consejo del mdico. Asegrese de hacerle al mdico cualquier pregunta que tenga. Document Revised: 04/07/2020 Document Reviewed: 04/07/2020 Elsevier Patient Education  2021 [place]   1) The drugs that you were given will stay in your system until tomorrow so for the next 24 hours you should not:  A) Drive an automobile B) Make any legal decisions C) Drink any alcoholic beverage   2) You may resume regular meals tomorrow.   Today it is better to start with liquids and gradually work up to solid foods.  You may eat anything you prefer, but it is better to start with liquids, then soup and crackers, and gradually work up to solid foods.   3) Please notify your doctor immediately if you have any unusual bleeding, trouble breathing, redness and pain at the surgery site, drainage, fever, or pain not relieved by medication.    4) Additional Instructions:        Please contact your physician with any problems or Same Day Surgery at [phone], Monday through Friday 6 am to 4 pm, or [facility] at [facility] number at [phone].     CIRUGIA AMBULATORIA       Instruccionnes de alta       1.  Las drogas que se Statistician en su cuerpo [employer], asi            que por las proximas 24 horas usted no debe:   Conducir Scientist, research (medical)) un automovil   Hacer ninguna decision legal   Tomar ninguna bebida alcoholica  2.  A) [place] puede comenzar una dieta regular.  Es mejor que hoy empiece con           liquidos y gradualmente anada comidas solidas.       B) Puede comer cualquier comida que desee pero es mejor empezar con liquidos,                      luego sopitas con galletas saladas y gradualmente llegar a las comidas solidas.  3.  Por favor avise a su medico inmediatamente si usted tiene algun sangrado anormal,       tiene dificultad con la respiracion, enrojecimiento y Social research officer, government en el sitio de la cirugia, [place],       fiebro o dolor que se alivia con [place].

## 2020-10-01 NOTE — H&P (Signed)
Pulmonary Medicine          Date: 10/01/2020,   MRN# [id number] Andrew Elliott 1961/06/29     AdmissionWeight: 90.7 kg                 CurrentWeight: 90.7 kg      CHIEF COMPLAINT:   Hilar/mediastinal lymphadenopathy   HISTORY OF PRESENT ILLNESS   This is a very pleasant with hx of asthma, GERD, DM, hx of COVID19, he was seen in pulmonary clinic with Dr Vella Kohler and had CT chest with findings of hilar and mediastinal lymphadenopath.  We reviewed these images together. He has bilateral adenopathy suggestive of lymphoma vs possible sarcoidosis.    PAST MEDICAL HISTORY   Past Medical History:  Diagnosis Date  . Asthma   . COVID-19 09/2019  . Diabetes mellitus without complication ([place])   . Dyspnea    with exertion  . GERD (gastroesophageal reflux disease)   . Hypothyroid 2013  . Pneumonia 2021     SURGICAL HISTORY   Past Surgical History:  Procedure Laterality Date  . COLONOSCOPY WITH PROPOFOL N/A 10/19/2016   Procedure: COLONOSCOPY WITH PROPOFOL;  Surgeon: Lollie Sails, MD;  Location: [facility] ENDOSCOPY;  Service: Endoscopy;  Laterality: N/A;  . DISTAL BICEPS TENDON REPAIR Left 01/23/2018   Procedure: PRIMARY DISTAL BICEPS TENDON REPAIR TENDON RUPTURE;  Surgeon: Corky Mull, MD;  Location: [facility];  Service: Orthopedics;  Laterality: Left;  C ARM OR FLUOROSCAN UNIT BIOMET TOGGLE LOK DEVICE  . ESOPHAGOGASTRODUODENOSCOPY (EGD) WITH PROPOFOL N/A 10/19/2016   Procedure: ESOPHAGOGASTRODUODENOSCOPY (EGD) WITH PROPOFOL;  Surgeon: Lollie Sails, MD;  Location: [facility] ENDOSCOPY;  Service: Endoscopy;  Laterality: N/A;     FAMILY HISTORY   History reviewed. No pertinent family history.   SOCIAL HISTORY   Social History   Tobacco Use  . Smoking status: Never Smoker  . Smokeless tobacco: Never Used  Vaping Use  . Vaping Use: Never used  Substance Use Topics  . Alcohol use: No  . Drug use: No     MEDICATIONS    Home Medication:     Current Medication:  Current Facility-Administered Medications:  .  0.9 %  sodium chloride infusion, , Intravenous, Continuous, Piscitello, Precious Haws, MD, Last Rate: 50 mL/hr at 10/01/20 1146, New Bag at 10/01/20 1146    ALLERGIES   Nsaids and Amoxicillin     REVIEW OF SYSTEMS    Review of Systems:  Gen:  Denies  fever, sweats, chills weigh loss  HEENT: Denies blurred vision, double vision, ear pain, eye pain, hearing loss, nose bleeds, sore throat Cardiac:  No dizziness, chest pain or heaviness, chest tightness,edema Resp:   Denies cough or sputum porduction, shortness of breath,wheezing, hemoptysis,  Gi: Denies swallowing difficulty, stomach pain, nausea or vomiting, diarrhea, constipation, bowel incontinence Gu:  Denies bladder incontinence, burning urine Ext:   Denies Joint pain, stiffness or swelling Skin: Denies  skin rash, easy bruising or bleeding or hives Endoc:  Denies polyuria, polydipsia , polyphagia or weight change Psych:   Denies depression, insomnia or hallucinations   Other:  All other systems negative   VS: BP 122/85   Pulse 68   Temp 98.7 F (37.1 C) (Temporal)   Resp 16   Ht 5\' 7"  (1.702 m)   Wt 90.7 kg   SpO2 98%   BMI 31.32 kg/m      PHYSICAL EXAM    GENERAL:NAD, no fevers, chills, no weakness no fatigue HEAD:  Normocephalic, atraumatic.  EYES: Pupils equal, round, reactive to light. Extraocular muscles intact. No scleral icterus.  MOUTH: Moist mucosal membrane. Dentition intact. No abscess noted.  EAR, NOSE, THROAT: Clear without exudates. No external lesions.  NECK: Supple. No thyromegaly. No nodules. No JVD.  PULMONARY: Diffuse coarse rhonchi right sided +wheezes CARDIOVASCULAR: S1 and S2. Regular rate and rhythm. No murmurs, rubs, or gallops. No edema. Pedal pulses 2+ bilaterally.  GASTROINTESTINAL: Soft, nontender, nondistended. No masses. Positive bowel sounds. No hepatosplenomegaly.  MUSCULOSKELETAL: No swelling, clubbing, or  edema. Range of motion full in all extremities.  NEUROLOGIC: Cranial nerves II through XII are intact. No gross focal neurological deficits. Sensation intact. Reflexes intact.  SKIN: No ulceration, lesions, rashes, or cyanosis. Skin warm and dry. Turgor intact.  PSYCHIATRIC: Mood, affect within normal limits. The patient is awake, alert and oriented x 3. Insight, judgment intact.       IMAGING    No results found.     CLINICAL DATA:  60 year old male with history of COVID-19 infection in February 2021 with persistent shortness of breath since at time.  EXAM: CT CHEST WITH CONTRAST  TECHNIQUE: Multidetector CT imaging of the chest was performed during intravenous contrast administration.  CONTRAST:  43mL OMNIPAQUE IOHEXOL 300 MG/ML  SOLN  COMPARISON:  Chest CT 09/27/2019.  FINDINGS: Cardiovascular: Heart size is normal. There is no significant pericardial fluid, thickening or pericardial calcification. Atherosclerotic calcifications in the left anterior descending and right coronary arteries.  Mediastinum/Nodes: Numerous enlarged and borderline enlarged bilateral mediastinal and hilar lymph nodes, largest of which is a subcarinal node measuring up to 2.1 cm in short axis, increased compared to the prior study. Esophagus is unremarkable in appearance. No axillary lymphadenopathy.  Lungs/Pleura: Small amount of patchy ground-glass attenuation and septal thickening noted in the lung bases, most severe in the left lower lobe. No confluent consolidative airspace disease. No pleural effusions. Tiny calcified granuloma in the right middle lobe.  Upper Abdomen: Unremarkable.  Musculoskeletal: There are no aggressive appearing lytic or blastic lesions noted in the visualized portions of the skeleton.  IMPRESSION: 1. Patchy areas of ground-glass attenuation and septal thickening in the lungs bilaterally, most severe in the left lower lobe. These findings likely  reflect some resolving post infectious or inflammatory scarring given the patient's prior history of COVID-19 infection. 2. Extensive lymphadenopathy in mediastinal and bilateral hilar nodal stations, new compared to the prior examination.  Further clinical evaluation is recommended.   Electronically Signed   By: Vinnie Langton M.D.   On: 07/28/2020 14:36   ASSESSMENT/PLAN   Hilar and mediastinal lymphadenopathy Differential considerations include a lymphoproliferative disorder or systemic disease such as sarcoidosis. -patient is at hospital for Endobronchial ultrasound assisted bronchoscopy with lymph node biopsies.  -Reviewed risks/complications and benefits with patient, risks include infection, pneumothorax/pneumomediastinum which may require chest tube placement as well as overnight/prolonged hospitalization and possible mechanical ventilation. Other risks include bleeding and very rarely death.  Patient understands risks and wishes to proceed.  Additional questions were answered, and patient is aware that post procedure patient will be going home with family and may experience cough with possible clots on expectoration as well as phlegm which may last few days as well as hoarseness of voice post intubation and mechanical ventilation.    Thank you for allowing me to participate in the care of this patient.   Patient/Family are satisfied with care plan and all questions have been answered.  This document was prepared using Systems analyst  and may include unintentional dictation errors.     Ottie Glazier, M.D.  Division of [place]

## 2020-10-03 NOTE — Anesthesia Postprocedure Evaluation (Signed)
Anesthesia Post Note  Patient: Andrew Elliott  Procedure(s) Performed: VIDEO BRONCHOSCOPY WITH ENDOBRONCHIAL ULTRASOUND (N/A ) VIDEO BRONCHOSCOPY WITH ENDOBRONCHIAL NAVIGATION (N/A )  Patient location during evaluation: PACU Anesthesia Type: General Level of consciousness: awake and alert Pain management: pain level controlled Vital Signs Assessment: post-procedure vital signs reviewed and stable Respiratory status: spontaneous breathing, nonlabored ventilation, respiratory function stable and patient connected to nasal cannula oxygen Cardiovascular status: blood pressure returned to baseline and stable Postop Assessment: no apparent nausea or vomiting Anesthetic complications: no   No complications documented.   Last Vitals:  Vitals:   10/01/20 1330 10/01/20 1341  BP: 111/75 102/68  Pulse: 66 70  Resp:  18  Temp:  36.6 C  SpO2: 96% 98%    Last Pain:  Vitals:   10/01/20 1341  TempSrc: Temporal  PainSc: 0-No pain                 Molli Barrows

## 2020-10-04 ENCOUNTER — Encounter: Payer: Self-pay | Admitting: Pulmonary Disease

## 2020-10-05 ENCOUNTER — Encounter: Payer: Self-pay | Admitting: Pulmonary Disease

## 2020-10-05 LAB — CYTOLOGY - NON PAP

## 2020-10-08 LAB — CYTOLOGY - NON PAP

## 2020-11-06 ENCOUNTER — Emergency Department: Payer: BC Managed Care – PPO

## 2020-11-06 ENCOUNTER — Emergency Department
Admission: EM | Admit: 2020-11-06 | Discharge: 2020-11-06 | Disposition: A | Discharge status: Home / Self Care | Payer: BC Managed Care – PPO | Attending: Emergency Medicine | Admitting: Emergency Medicine

## 2020-11-06 ENCOUNTER — Other Ambulatory Visit: Payer: Self-pay

## 2020-11-06 DIAGNOSIS — J45909 Unspecified asthma, uncomplicated: Secondary | ICD-10-CM | POA: Insufficient documentation

## 2020-11-06 DIAGNOSIS — N200 Calculus of kidney: Secondary | ICD-10-CM | POA: Diagnosis not present

## 2020-11-06 DIAGNOSIS — Z7951 Long term (current) use of inhaled steroids: Secondary | ICD-10-CM | POA: Diagnosis not present

## 2020-11-06 DIAGNOSIS — E039 Hypothyroidism, unspecified: Secondary | ICD-10-CM | POA: Insufficient documentation

## 2020-11-06 DIAGNOSIS — Z7984 Long term (current) use of oral hypoglycemic drugs: Secondary | ICD-10-CM | POA: Insufficient documentation

## 2020-11-06 DIAGNOSIS — Z8616 Personal history of COVID-19: Secondary | ICD-10-CM | POA: Insufficient documentation

## 2020-11-06 DIAGNOSIS — R1032 Left lower quadrant pain: Secondary | ICD-10-CM | POA: Diagnosis present

## 2020-11-06 DIAGNOSIS — Z79899 Other long term (current) drug therapy: Secondary | ICD-10-CM | POA: Insufficient documentation

## 2020-11-06 LAB — COMPREHENSIVE METABOLIC PANEL
ALT: 42 U/L (ref 0–44)
AST: 46 U/L — ABNORMAL HIGH (ref 15–41)
Albumin: 4.4 g/dL (ref 3.5–5.0)
Alkaline Phosphatase: 82 U/L (ref 38–126)
Anion gap: 11 (ref 5–15)
BUN: 30 mg/dL — ABNORMAL HIGH (ref 8–23)
CO2: 20 mmol/L — ABNORMAL LOW (ref 22–32)
Calcium: 9.4 mg/dL (ref 8.9–10.3)
Chloride: 104 mmol/L (ref 98–111)
Creatinine, Ser: 1.08 mg/dL (ref 0.61–1.24)
GFR, Estimated: >60 mL/min (ref >60)
Glucose, Bld: 201 mg/dL — ABNORMAL HIGH (ref 70–99)
Potassium: 3.9 mmol/L (ref 3.5–5.1)
Sodium: 135 mmol/L (ref 135–145)
Total Bilirubin: 0.7 mg/dL (ref 0.3–1.2)
Total Protein: 7.8 g/dL (ref 6.5–8.1)

## 2020-11-06 LAB — LIPASE, BLOOD: Lipase: 35 U/L (ref 11–51)

## 2020-11-06 LAB — URINALYSIS, COMPLETE (UACMP) WITH MICROSCOPIC
Bacteria, UA: NONE SEEN
Bilirubin Urine: NEGATIVE
Glucose, UA: 50 mg/dL — AB
Ketones, ur: NEGATIVE mg/dL
Leukocytes,Ua: NEGATIVE
Nitrite: NEGATIVE
Protein, ur: NEGATIVE mg/dL
Specific Gravity, Urine: 1.019 (ref 1.005–1.030)
pH: 6 (ref 5.0–8.0)

## 2020-11-06 LAB — CBC
HCT: 46 % (ref 39.0–52.0)
Hemoglobin: 16.1 g/dL (ref 13.0–17.0)
MCH: 30.3 pg (ref 26.0–34.0)
MCHC: 35 g/dL (ref 30.0–36.0)
MCV: 86.6 fL (ref 80.0–100.0)
Platelets: 313 10*3/uL (ref 150–400)
RBC: 5.31 MIL/uL (ref 4.22–5.81)
RDW: 12.8 % (ref 11.5–15.5)
WBC: 11.5 10*3/uL — ABNORMAL HIGH (ref 4.0–10.5)
nRBC: 0 % (ref 0.0–0.2)

## 2020-11-06 LAB — TROPONIN I (HIGH SENSITIVITY): Troponin I (High Sensitivity): 6 ng/L (ref <18)

## 2020-11-06 MED ORDER — HYDROMORPHONE HCL 1 MG/ML IJ SOLN
0.5000 mg | Freq: Once | INTRAMUSCULAR | Status: AC
Start: 2020-11-06 — End: 2020-11-06
  Administered 2020-11-06: 0.5 mg via INTRAVENOUS
  Filled 2020-11-06: qty 1

## 2020-11-06 MED ORDER — IOHEXOL 300 MG/ML  SOLN
100.0000 mL | Freq: Once | INTRAMUSCULAR | Status: AC | PRN
  Administered 2020-11-06: 100 mL via INTRAVENOUS

## 2020-11-06 MED ORDER — ONDANSETRON 4 MG PO TBDP: 4.0000 mg | ORAL_TABLET | Freq: Three times a day (TID) | ORAL | 0 refills | Status: DC | PRN

## 2020-11-06 MED ORDER — ONDANSETRON HCL 4 MG/2ML IJ SOLN
4.0000 mg | Freq: Once | INTRAMUSCULAR | Status: AC
  Administered 2020-11-06: 4 mg via INTRAVENOUS
  Filled 2020-11-06: qty 2

## 2020-11-06 MED ORDER — OXYCODONE HCL 5 MG PO TABS: 5.0000 mg | ORAL_TABLET | Freq: Three times a day (TID) | ORAL | 0 refills | Status: AC | PRN

## 2020-11-06 MED ORDER — SODIUM CHLORIDE 0.9 % IV BOLUS
500.0000 mL | Freq: Once | INTRAVENOUS | Status: AC
  Administered 2020-11-06: 500 mL via INTRAVENOUS

## 2020-11-06 MED ORDER — TAMSULOSIN HCL 0.4 MG PO CAPS: 0.4000 mg | ORAL_CAPSULE | Freq: Every day | ORAL | 0 refills | Status: AC

## 2020-11-06 NOTE — ED Notes (Signed)
Patient transported to CT 

## 2020-11-06 NOTE — Discharge Instructions (Addendum)
Take Tylenol 1 g every 8 hours and ibuprofen 400 every 6 hours with food.  Take the oxycodone for breakthrough pain.  Do not take with his tramadol.  Do not drive or operate machinery while on the oxycodone.  Take Zofran to help with nausea and Flomax to help dilate the urethra return to the ER for fevers  Take oxycodone as prescribed. Do not drink alcohol, drive or participate in any other potentially dangerous activities while taking this medication as it may make you sleepy. Do not take this medication with any other sedating medications, either prescription or over-the-counter. If you were prescribed Percocet or Vicodin, do not take these with acetaminophen (Tylenol) as it is already contained within these medications.  This medication is an opiate (or narcotic) pain medication and can be habit forming. Use it as little as possible to achieve adequate pain control. Do not use or use it with extreme caution if you have a history of opiate abuse or dependence. If you are on a pain contract with your primary care doctor or a pain specialist, be sure to let them know you were prescribed this medication today from the [facility] Emergency Department. This medication is intended for your use only - do not give any to anyone else and keep it in a secure place where nobody else, especially children, have access to it.

## 2020-11-06 NOTE — ED Notes (Signed)
ED Provider at bedside. 

## 2020-11-06 NOTE — ED Triage Notes (Addendum)
Pt comes pov with LLQ pain since yesterday 2pm. Pt holding his breath from the pain. Holding his side. No recent falls. Pt did not want interpreter for triage.

## 2020-11-06 NOTE — ED Provider Notes (Signed)
[facility] Emergency Department Provider Note  ____________________________________________   Event Date/Time   First MD Initiated Contact with Patient 11/06/20 2040     (approximate)  I have reviewed the triage vital signs and the nursing notes.   HISTORY  Chief Complaint Abdominal Pain    HPI JAISHAUN MCNAB is a 60 y.o. male with diabetes who comes in with left lower quadrant abdominal pain.  Patient's been having some pain for the last few days, constant, nothing makes better, nothing makes it worse.  Some nausea but no vomiting.  No abnormalities to his bowel movements, no dysuria.  Declined interpreter prefers to interpret      Past Medical History:  Diagnosis Date  . Asthma   . COVID-19 09/2019  . Diabetes mellitus without complication ([place])   . Dyspnea    with exertion  . GERD (gastroesophageal reflux disease)   . Hypothyroid 2013  . Pneumonia 2021    Patient Active Problem List   Diagnosis Date Noted  . Acute hypoxemic respiratory failure due to COVID-19 ([place]) 09/30/2019  . Chest pain 09/30/2019  . Acute respiratory failure with hypoxia ([place]) 09/30/2019  . Rotator cuff tendinitis, left 06/28/2018  . Rupture of left distal biceps tendon 01/21/2018  . Elevated alanine aminotransferase (ALT) level 06/18/2015  . Hypertriglyceridemia 06/18/2015  . Pre-diabetes 06/18/2015  . Vitamin D deficiency 06/18/2015  . Gastroesophageal reflux disease without esophagitis 03/12/2015  . Hydrocele 01/30/2014  . Adult hypothyroidism 12/30/2013    Past Surgical History:  Procedure Laterality Date  . COLONOSCOPY WITH PROPOFOL N/A 10/19/2016   Procedure: COLONOSCOPY WITH PROPOFOL;  Surgeon: Lollie Sails, MD;  Location: [facility] ENDOSCOPY;  Service: Endoscopy;  Laterality: N/A;  . DISTAL BICEPS TENDON REPAIR Left 01/23/2018   Procedure: PRIMARY DISTAL BICEPS TENDON REPAIR TENDON RUPTURE;  Surgeon: Corky Mull, MD;  Location: [place];   Service: Orthopedics;  Laterality: Left;  C ARM OR FLUOROSCAN UNIT BIOMET TOGGLE LOK DEVICE  . ESOPHAGOGASTRODUODENOSCOPY (EGD) WITH PROPOFOL N/A 10/19/2016   Procedure: ESOPHAGOGASTRODUODENOSCOPY (EGD) WITH PROPOFOL;  Surgeon: Lollie Sails, MD;  Location: [facility] ENDOSCOPY;  Service: Endoscopy;  Laterality: N/A;  . VIDEO BRONCHOSCOPY WITH ENDOBRONCHIAL NAVIGATION N/A 10/01/2020   Procedure: VIDEO BRONCHOSCOPY WITH ENDOBRONCHIAL NAVIGATION;  Surgeon: Ottie Glazier, MD;  Location: [facility] ORS;  Service: Thoracic;  Laterality: N/A;  . VIDEO BRONCHOSCOPY WITH ENDOBRONCHIAL ULTRASOUND N/A 10/01/2020   Procedure: VIDEO BRONCHOSCOPY WITH ENDOBRONCHIAL ULTRASOUND;  Surgeon: Ottie Glazier, MD;  Location: [facility] ORS;  Service: Thoracic;  Laterality: N/A;    Prior to Admission medications   Medication Sig Start Date End Date Taking? Authorizing Provider  ADVAIR DISKUS 250-50 MCG/DOSE AEPB Inhale 1 puff into the lungs in the morning and at bedtime. 09/02/20   [provider]  albuterol (VENTOLIN HFA) 108 (90 Base) MCG/ACT inhaler Inhale 1-2 puffs into the lungs every 4 (four) hours as needed for wheezing or shortness of breath. 09/25/19   [provider]  atorvastatin (LIPITOR) 10 MG tablet Take 10 mg by mouth every evening. 08/16/20   [provider]  Cholecalciferol (VITAMIN D3) 50 MCG (2000 UT) TABS Take 2,000 Units by mouth daily.    [provider]  diclofenac (VOLTAREN) 75 MG EC tablet Take 1 tablet (75 mg total) by mouth 2 (two) times daily. Patient taking differently: Take 75 mg by mouth daily. 11/27/18   Wallene Huh, DPM  levothyroxine (SYNTHROID) 75 MCG tablet Take 75 mcg by mouth daily before breakfast. 07/18/19  [provider]  metFORMIN (GLUCOPHAGE-XR) 500 MG 24 hr tablet Take 500 mg by mouth daily in the afternoon. 08/16/20   [provider]  Multiple Vitamin (MULTIVITAMIN WITH MINERALS) TABS tablet Take 1 tablet by mouth daily. One-A-Day     [provider]  nystatin-triamcinolone (MYCOLOG II) cream Apply 1 application topically 2 (two) times daily as needed. 08/06/20   [provider]  omeprazole (PRILOSEC) 20 MG capsule Take 20 mg by mouth 2 (two) times daily. 09/13/20   [provider]  traMADol (ULTRAM) 50 MG tablet Take 50 mg by mouth 2 (two) times daily as needed for pain. 05/24/20   [provider]    Allergies Nsaids and Amoxicillin  History reviewed. No pertinent family history.  Social History Social History   Tobacco Use  . Smoking status: Never Smoker  . Smokeless tobacco: Never Used  Vaping Use  . Vaping Use: Never used  Substance Use Topics  . Alcohol use: No  . Drug use: No      Review of Systems Constitutional: No fever/chills Eyes: No visual changes. ENT: No sore throat. Cardiovascular: Denies chest pain. Respiratory: Denies shortness of breath. Gastrointestinal: + abd pain  No nausea, no vomiting.  No diarrhea.  No constipation. Genitourinary: Negative for dysuria. Musculoskeletal: Negative for back pain. Skin: Negative for rash. Neurological: Negative for headaches, focal weakness or numbness. All other ROS negative ____________________________________________   PHYSICAL EXAM:  VITAL SIGNS: ED Triage Vitals  Enc Vitals Group     BP 11/06/20 1934 (!) 163/94     Pulse Rate 11/06/20 1934 (!) 54     Resp 11/06/20 1934 (!) 25     Temp 11/06/20 1934 97.6 F (36.4 C)     Temp Source 11/06/20 1934 Oral     SpO2 11/06/20 1934 100 %     Weight 11/06/20 1933 200 lb (90.7 kg)     Height 11/06/20 1933 5\' 7"  (1.702 m)     Head Circumference --      Peak Flow --      Pain Score 11/06/20 1933 10     Pain Loc --      Pain Edu? --      Excl. in [place]? --     Constitutional: Alert and oriented. Well appearing and in no acute distress. Eyes: Conjunctivae are normal. EOMI. Head: Atraumatic. Nose: No congestion/rhinnorhea. Mouth/Throat: Mucous membranes are  moist.   Neck: No stridor. Trachea Midline. FROM Cardiovascular: Normal rate, regular rhythm. Grossly normal heart sounds.  Good peripheral circulation. Respiratory: Normal respiratory effort.  No retractions. Lungs CTAB. Gastrointestinal: Soft with tenderness LLQ. No distention. No abdominal bruits.  Musculoskeletal: No lower extremity tenderness nor edema.  No joint effusions. Neurologic:  Normal speech and language. No gross focal neurologic deficits are appreciated.  Skin:  Skin is warm, dry and intact. No rash noted. Psychiatric: Mood and affect are normal. Speech and behavior are normal. GU: Deferred   ____________________________________________   LABS (all labs ordered are listed, but only abnormal results are displayed)  Labs Reviewed  COMPREHENSIVE METABOLIC PANEL - Abnormal; Notable for the following components:      Result Value   CO2 20 (*)    Glucose, Bld 201 (*)    BUN 30 (*)    AST 46 (*)    All other components within normal limits  CBC - Abnormal; Notable for the following components:   WBC 11.5 (*)    All other components within normal limits  URINALYSIS, COMPLETE (UACMP) WITH MICROSCOPIC - Abnormal; Notable for the following components:   Color, Urine YELLOW (*)    APPearance CLEAR (*)    Glucose, UA 50 (*)    Hgb urine dipstick LARGE (*)    All other components within normal limits  LIPASE, BLOOD  TROPONIN I (HIGH SENSITIVITY)   ____________________________________________   ED ECG REPORT I, Vanessa Kite, the attending physician, personally viewed and interpreted this ECG.  Sinus bradycardia rate of 56, no ST elevation, no T wave inversions, normal intervals ____________________________________________  RADIOLOGY  Official radiology report(s): CT ABDOMEN PELVIS W CONTRAST  Result Date: 11/06/2020 CLINICAL DATA:  Left-sided abdominal pain for 2 days EXAM: CT ABDOMEN AND PELVIS WITH CONTRAST TECHNIQUE: Multidetector CT imaging of the abdomen and  pelvis was performed using the standard protocol following bolus administration of intravenous contrast. CONTRAST:  149mL OMNIPAQUE IOHEXOL 300 MG/ML  SOLN COMPARISON:  05/24/2017, CT of the chest from 07/28/2020 FINDINGS: Lower chest: Lung bases demonstrate minimal left basilar infiltrate. No sizable effusion is noted. Stable subcarinal adenopathy and right infrahilar adenopathy is seen compared with prior CT. Hepatobiliary: Mild fatty infiltration of the liver is noted. Gallbladder is within normal limits. Pancreas: Unremarkable. No pancreatic ductal dilatation or surrounding inflammatory changes. Spleen: Normal in size without focal abnormality. Adrenals/Urinary Tract: Adrenal glands are within normal limits. Kidneys demonstrate a normal enhancement pattern. Fullness of the left collecting system is noted. Left ureter is prominent as well extending to the level of the left UVJ. A small obstructing stone is noted at the left UVJ measuring approximately 2 mm. Bladder is partially distended. Stomach/Bowel: The appendix is within normal limits. No obstructive or inflammatory changes of the colon are seen. Small bowel and stomach appear within normal limits. Vascular/Lymphatic: No significant vascular findings are present. No enlarged abdominal or pelvic lymph nodes. Retroaortic left renal vein is seen. Reproductive: Prostate is unremarkable. Other: No abdominal wall hernia or abnormality. No abdominopelvic ascites. Musculoskeletal: No acute or significant osseous findings. IMPRESSION: Small 2 mm distal left ureteral stone with obstructive change. Mild left basilar infiltrate. Stable lymphadenopathy in the subcarinal and right infrahilar region. Electronically Signed   By: Inez Catalina M.D.   On: 11/06/2020 22:01    ____________________________________________   PROCEDURES  Procedure(s) performed (including Critical Care):  Procedures   ____________________________________________   INITIAL IMPRESSION  / ASSESSMENT AND PLAN / ED [place] was evaluated in Emergency Department on 11/06/2020 for the symptoms described in the history of present illness. He was evaluated in the context of the global COVID-19 pandemic, which necessitated consideration that the patient might be at risk for infection with the SARS-CoV-2 virus that causes COVID-19. Institutional protocols and algorithms that pertain to the evaluation of patients at risk for COVID-19 are in a state of rapid change based on information released by regulatory bodies including the CDC and federal and state organizations. These policies and algorithms were followed during the patient's care in the ED.    Patient is 60 year old who comes in with left lower quadrant pain.  Labs are ordered to evaluate [place] abnormalities, AKI.  Patient was given some IV Dilaudid and IV Zofran to help with pain.  CT scan was ordered to evaluate for appendicitis, diverticulitis, kidney stone, obstruction, AAA.  Reevaluated patient pain is much better.  Labs are reassuring.  CT scan confirms kidney stone 2 mm patient is afebrile and his urine is without evidence of UTI therefore is not a septic stone.  Patient's pain is well controlled he had no vomiting.  Given this patient will be safe for discharge home.  We discussed treatment management with Tylenol, oxycodone for breakthrough pain, Zofran and tamsulosin.  Given urology for follow-up.  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  On reassessment patient's pain is now gone     ____________________________________________   FINAL CLINICAL IMPRESSION(S) / ED DIAGNOSES   Final diagnoses:  Kidney stone      MEDICATIONS GIVEN DURING THIS VISIT:  Medications  HYDROmorphone (DILAUDID) injection 0.5 mg (0.5 mg Intravenous  Given 11/06/20 2137)  ondansetron (ZOFRAN) injection 4 mg (4 mg Intravenous Given 11/06/20 2136)  sodium chloride 0.9 % bolus 500 mL (0 mLs Intravenous Stopped 11/06/20 2229)  iohexol (OMNIPAQUE) 300 MG/ML solution 100 mL (100 mLs Intravenous Contrast Given 11/06/20 2147)     ED Discharge Orders         Ordered    tamsulosin (FLOMAX) 0.4 MG CAPS capsule  Daily        11/06/20 2259    ondansetron (ZOFRAN ODT) 4 MG disintegrating tablet  Every 8 hours PRN        11/06/20 2259    oxyCODONE (ROXICODONE) 5 MG immediate release tablet  Every 8 hours PRN        11/06/20 2259           Note:  This document was prepared using Dragon voice recognition software and may include unintentional dictation errors.   Vanessa [place], MD 11/06/20 2300

## 2021-10-25 ENCOUNTER — Other Ambulatory Visit: Payer: Self-pay | Admitting: Specialist

## 2021-10-25 DIAGNOSIS — D862 Sarcoidosis of lung with sarcoidosis of lymph nodes: Secondary | ICD-10-CM

## 2021-11-07 ENCOUNTER — Ambulatory Visit: Payer: BC Managed Care – PPO

## 2021-11-15 ENCOUNTER — Other Ambulatory Visit: Payer: BC Managed Care – PPO

## 2021-11-16 ENCOUNTER — Ambulatory Visit
Admission: RE | Admit: 2021-11-16 | Discharge: 2021-11-16 | Disposition: A | Discharge status: Home / Self Care | Payer: BC Managed Care – PPO | Source: Ambulatory Visit | Attending: Specialist | Admitting: Specialist

## 2021-11-16 DIAGNOSIS — D862 Sarcoidosis of lung with sarcoidosis of lymph nodes: Secondary | ICD-10-CM | POA: Insufficient documentation

## 2021-11-23 ENCOUNTER — Other Ambulatory Visit: Payer: Self-pay | Admitting: Specialist

## 2021-11-23 DIAGNOSIS — D860 Sarcoidosis of lung: Secondary | ICD-10-CM

## 2022-08-18 ENCOUNTER — Ambulatory Visit
Admission: EM | Admit: 2022-08-18 | Discharge: 2022-08-18 | Disposition: A | Discharge status: Home / Self Care | Payer: BC Managed Care – PPO | Attending: Emergency Medicine | Admitting: Emergency Medicine

## 2022-08-18 DIAGNOSIS — J069 Acute upper respiratory infection, unspecified: Secondary | ICD-10-CM | POA: Diagnosis not present

## 2022-08-18 MED ORDER — AZITHROMYCIN 250 MG PO TABS
250.0000 mg | ORAL_TABLET | Freq: Every day | ORAL | 0 refills | Status: DC
Start: 2022-08-18 — End: 2023-10-06

## 2022-08-18 MED ORDER — PROMETHAZINE-DM 6.25-15 MG/5ML PO SYRP
5.0000 mL | ORAL_SOLUTION | Freq: Every evening | ORAL | 0 refills | Status: DC | PRN
Start: 2022-08-18 — End: 2022-09-10

## 2022-08-18 MED ORDER — BENZONATATE 100 MG PO CAPS
100.0000 mg | ORAL_CAPSULE | Freq: Three times a day (TID) | ORAL | 0 refills | Status: DC
Start: 2022-08-18 — End: 2022-09-10

## 2022-08-18 NOTE — ED Provider Notes (Signed)
[facility]    CSN: [id number] Arrival date & time: 08/18/22  1920      History   Chief Complaint No chief complaint on file.   HPI Andrew Elliott is a 62 y.o. male.   Patient presents for evaluation of fever, chills, nasal congestion, rhinorrhea, sore throat and productive cough present for 4 days.  Fever is subjective.  Has resolved.  Experiencing shortness of breath only after coughing.  Known sick contacts.  Tolerating food and liquids.  Has attempted use of TheraFlu and Tylenol which have been minimally effective.  History of asthma, sarcoidosis, pulmonary nodules, pneumonia.  Denies shortness of breath or wheezing. Past Medical History:  Diagnosis Date   Asthma    COVID-19 09/2019   Diabetes mellitus without complication ([place])    Dyspnea    with exertion   GERD (gastroesophageal reflux disease)    Hypothyroid 2013   Pneumonia 2021    Patient Active Problem List   Diagnosis Date Noted   Acute hypoxemic respiratory failure due to COVID-19 ([place]) 09/30/2019   Chest pain 09/30/2019   Acute respiratory failure with hypoxia ([place]) 09/30/2019   Rotator cuff tendinitis, left 06/28/2018   Rupture of left distal biceps tendon 01/21/2018   Elevated alanine aminotransferase (ALT) level 06/18/2015   Hypertriglyceridemia 06/18/2015   Pre-diabetes 06/18/2015   Vitamin D deficiency 06/18/2015   Gastroesophageal reflux disease without esophagitis 03/12/2015   Hydrocele 01/30/2014   Adult hypothyroidism 12/30/2013    Past Surgical History:  Procedure Laterality Date   COLONOSCOPY WITH PROPOFOL N/A 10/19/2016   Procedure: COLONOSCOPY WITH PROPOFOL;  Surgeon: Lollie Sails, MD;  Location: [facility] ENDOSCOPY;  Service: Endoscopy;  Laterality: N/A;   DISTAL BICEPS TENDON REPAIR Left 01/23/2018   Procedure: PRIMARY DISTAL BICEPS TENDON REPAIR TENDON RUPTURE;  Surgeon: Corky Mull, MD;  Location: [facility];  Service: Orthopedics;  Laterality: Left;  C ARM OR  FLUOROSCAN UNIT BIOMET TOGGLE LOK DEVICE   ESOPHAGOGASTRODUODENOSCOPY (EGD) WITH PROPOFOL N/A 10/19/2016   Procedure: ESOPHAGOGASTRODUODENOSCOPY (EGD) WITH PROPOFOL;  Surgeon: Lollie Sails, MD;  Location[facility]COPY;  Service: Endoscopy;  Laterality: N/A;   VIDEO BRONCHOSCOPY WITH ENDOBRONCHIAL NAVIGATION N/A 10/01/2020   Procedure: VIDEO BRONCHOSCOPY WITH ENDOBRONCHIAL NAVIGATION;  Surgeon: Ottie Glazier, MD;  Location: [facility] ORS;  Service: Thoracic;  Laterality: N/A;   VIDEO BRONCHOSCOPY WITH ENDOBRONCHIAL ULTRASOUND N/A 10/01/2020   Procedure: VIDEO BRONCHOSCOPY WITH ENDOBRONCHIAL ULTRASOUND;  Surgeon: Ottie Glazier, MD;  Location: [facility] ORS;  Service: Thoracic;  Laterality: N/A;       Home Medications    Prior to Admission medications   Medication Sig Start Date End Date Taking? Authorizing Provider  ADVAIR DISKUS 250-50 MCG/DOSE AEPB Inhale 1 puff into the lungs in the morning and at bedtime. 09/02/20  Yes [provider]  albuterol (VENTOLIN HFA) 108 (90 Base) MCG/ACT inhaler Inhale 1-2 puffs into the lungs every 4 (four) hours as needed for wheezing or shortness of breath. 09/25/19  Yes [provider]  atorvastatin (LIPITOR) 10 MG tablet Take 10 mg by mouth every evening. 08/16/20  Yes [provider]  Cholecalciferol (VITAMIN D3) 50 MCG (2000 UT) TABS Take 2,000 Units by mouth daily.   Yes [provider]  diclofenac (VOLTAREN) 75 MG EC tablet Take 1 tablet (75 mg total) by mouth 2 (two) times daily. Patient taking differently: Take 75 mg by mouth daily. 11/27/18  Yes RegalTamala Fothergill, DPM  levothyroxine (SYNTHROID) 75 MCG tablet Take 75 mcg by mouth  daily before breakfast. 07/18/19  Yes [provider]  metFORMIN (GLUCOPHAGE-XR) 500 MG 24 hr tablet Take 500 mg by mouth daily in the afternoon. 08/16/20  Yes [provider]  Multiple Vitamin (MULTIVITAMIN WITH MINERALS) TABS tablet Take 1 tablet by mouth daily. One-A-Day   Yes  [provider]  omeprazole (PRILOSEC) 20 MG capsule Take 20 mg by mouth 2 (two) times daily. 09/13/20  Yes [provider]  nystatin-triamcinolone (MYCOLOG II) cream Apply 1 application topically 2 (two) times daily as needed. 08/06/20   [provider]  ondansetron (ZOFRAN ODT) 4 MG disintegrating tablet Take 1 tablet (4 mg total) by mouth every 8 (eight) hours as needed for nausea or vomiting. 11/06/20   Vanessa Weldon Spring, MD    Family History History reviewed. No pertinent family history.  Social History Social History   Tobacco Use   Smoking status: Never   Smokeless tobacco: Never  Vaping Use   Vaping Use: Never used  Substance Use Topics   Alcohol use: No   Drug use: No     Allergies   Nsaids and Amoxicillin   Review of Systems Review of Systems  Constitutional:  Positive for chills and fever. Negative for activity change, appetite change, diaphoresis, fatigue and unexpected weight change.  HENT:  Positive for congestion, rhinorrhea and sore throat. Negative for dental problem, drooling, ear discharge, ear pain, facial swelling, hearing loss, mouth sores, nosebleeds, postnasal drip, sinus pressure, sinus pain, sneezing, tinnitus, trouble swallowing and voice change.   Respiratory:  Positive for cough. Negative for apnea, choking, chest tightness, shortness of breath, wheezing and stridor.   Cardiovascular: Negative.   Gastrointestinal: Negative.   Skin: Negative.      Physical Exam Triage Vital Signs ED Triage Vitals  Enc Vitals Group     BP 08/18/22 1932 139/86     Pulse Rate 08/18/22 1932 73     Resp 08/18/22 1932 18     Temp 08/18/22 1932 98.8 F (37.1 C)     Temp Source 08/18/22 1932 Oral     SpO2 08/18/22 1932 94 %     Weight 08/18/22 1931 200 lb (90.7 kg)     Height 08/18/22 1931 '5\' 7"'$  (1.702 m)     Head Circumference --      Peak Flow --      Pain Score 08/18/22 1930 7     Pain Loc --      Pain Edu? --      Excl. in [place]? --     No data found.  Updated Vital Signs BP 139/86 (BP Location: Left Arm)   Pulse 73   Temp 98.8 F (37.1 C) (Oral)   Resp 18   Ht '5\' 7"'$  (1.702 m)   Wt 200 lb (90.7 kg)   SpO2 94%   BMI 31.32 kg/m   Visual Acuity Right Eye Distance:   Left Eye Distance:   Bilateral Distance:    Right Eye Near:   Left Eye Near:    Bilateral Near:     Physical Exam Constitutional:      Appearance: Normal appearance.  HENT:     Head: Normocephalic.     Right Ear: Tympanic membrane, ear canal and external ear normal.     Left Ear: Tympanic membrane, ear canal and external ear normal.     Nose: Congestion and rhinorrhea present.     Mouth/Throat:     Mouth: Mucous membranes are moist.     Pharynx: Posterior  oropharyngeal erythema present.  Eyes:     Extraocular Movements: Extraocular movements intact.  Cardiovascular:     Rate and Rhythm: Normal rate and regular rhythm.     Pulses: Normal pulses.     Heart sounds: Normal heart sounds.  Pulmonary:     Effort: Pulmonary effort is normal.     Breath sounds: Normal breath sounds.  Skin:    General: Skin is warm and dry.  Neurological:     Mental Status: He is alert and oriented to person, place, and time. Mental status is at baseline.  Psychiatric:        Behavior: Behavior normal.      UC Treatments / Results  Labs (all labs ordered are listed, but only abnormal results are displayed) Labs Reviewed - No data to display  EKG   Radiology No results found.  Procedures Procedures (including critical care time)  Medications Ordered in UC Medications - No data to display  Initial Impression / Assessment and Plan / UC Course  I have reviewed the triage vital signs and the nursing notes.  Pertinent labs & imaging results that were available during my care of the patient were reviewed by me and considered in my medical decision making (see chart for details).  Viral URI with cough  Patient is in no signs of distress nor  toxic appearing.  Vital signs are stable.  Low suspicion for pneumonia, pneumothorax or bronchitis and therefore will defer imaging. Prescribed azithromycin prophylactically, Tessalon and Promethazine DM for symptom management.May use additional over-the-counter medications as needed for supportive care.  May follow-up with urgent care as needed if symptoms persist or worsen.   Final Clinical Impressions(s) / UC Diagnoses   Final diagnoses:  None     Discharge Instructions      Your symptoms today are most likely being caused by a virus and should steadily improve in time it can take up to 7 to 10 days before you truly start to see a turnaround however things will get better  Based on history of pulmonary nodules and sarcoidosis will prophylactically prescribe antibiotic to prevent symptoms from worsening, take azithromycin as directed  You may use Tessalon pill every 8 hours to help calm your coughing  May use cough syrup at bedtime, be mindful this may make you feel drowsy    You can take Tylenol and/or Ibuprofen as needed for fever reduction and pain relief.   For cough: honey 1/2 to 1 teaspoon (you can dilute the honey in water or another fluid).  You can also use guaifenesin and dextromethorphan for cough. You can use a humidifier for chest congestion and cough.  If you don't have a humidifier, you can sit in the bathroom with the hot shower running.      For sore throat: try warm salt water gargles, cepacol lozenges, throat spray, warm tea or water with lemon/honey, popsicles or ice, or OTC cold relief medicine for throat discomfort.   For congestion: take a daily anti-histamine like Zyrtec, Claritin, and a oral decongestant, such as pseudoephedrine.  You can also use Flonase 1-2 sprays in each nostril daily.   It is important to stay hydrated: drink plenty of fluids (water, gatorade/powerade/pedialyte, juices, or teas) to keep your throat moisturized and help further relieve  irritation/discomfort.    ED Prescriptions   None    PDMP not reviewed this encounter.   Hans Eden, NP 08/18/22 1946

## 2022-08-18 NOTE — Discharge Instructions (Signed)
Your symptoms today are most likely being caused by a virus and should steadily improve in time it can take up to 7 to 10 days before you truly start to see a turnaround however things will get better  Based on history of pulmonary nodules and sarcoidosis will prophylactically prescribe antibiotic to prevent symptoms from worsening, take azithromycin as directed  You may use Tessalon pill every 8 hours to help calm your coughing  May use cough syrup at bedtime, be mindful this may make you feel drowsy    You can take Tylenol and/or Ibuprofen as needed for fever reduction and pain relief.   For cough: honey 1/2 to 1 teaspoon (you can dilute the honey in water or another fluid).  You can also use guaifenesin and dextromethorphan for cough. You can use a humidifier for chest congestion and cough.  If you don't have a humidifier, you can sit in the bathroom with the hot shower running.      For sore throat: try warm salt water gargles, cepacol lozenges, throat spray, warm tea or water with lemon/honey, popsicles or ice, or OTC cold relief medicine for throat discomfort.   For congestion: take a daily anti-histamine like Zyrtec, Claritin, and a oral decongestant, such as pseudoephedrine.  You can also use Flonase 1-2 sprays in each nostril daily.   It is important to stay hydrated: drink plenty of fluids (water, gatorade/powerade/pedialyte, juices, or teas) to keep your throat moisturized and help further relieve irritation/discomfort.

## 2022-08-18 NOTE — ED Triage Notes (Addendum)
Pt is with his son  Pt c/o cough, chest pain from coughing x4days  Pt was tested for covid and flu and it was negative.   Pt was around son who was sick and also tested negative for covid or flu.

## 2022-09-10 ENCOUNTER — Ambulatory Visit
Admission: EM | Admit: 2022-09-10 | Discharge: 2022-09-10 | Disposition: A | Discharge status: Home / Self Care | Payer: BC Managed Care – PPO

## 2022-09-10 DIAGNOSIS — U071 COVID-19: Secondary | ICD-10-CM

## 2022-09-10 MED ORDER — BENZONATATE 100 MG PO CAPS: 200.0000 mg | ORAL_CAPSULE | Freq: Three times a day (TID) | ORAL | 0 refills | Status: DC

## 2022-09-10 MED ORDER — IPRATROPIUM BROMIDE 0.06 % NA SOLN: 2.0000 | Freq: Four times a day (QID) | NASAL | 12 refills | Status: DC

## 2022-09-10 MED ORDER — NIRMATRELVIR/RITONAVIR (PAXLOVID)TABLET: 3.0000 | ORAL_TABLET | Freq: Two times a day (BID) | ORAL | 0 refills | Status: AC

## 2022-09-10 MED ORDER — PROMETHAZINE-DM 6.25-15 MG/5ML PO SYRP: 5.0000 mL | ORAL_SOLUTION | Freq: Four times a day (QID) | ORAL | 0 refills | Status: DC | PRN

## 2022-09-10 NOTE — Discharge Instructions (Signed)
You will have to quarantine for 5 days from the start of your symptoms.  After 5 days you can break quarantine if your symptoms have improved and you have not had a fever for 24 hours without taking Tylenol or ibuprofen. You must wear a mask around others for an additional 5 days.  Use over-the-counter Tylenol as needed for body aches and fever.  Use the Atrovent nasal spray, 2 squirts in each nostril every 6 hours, as needed for runny nose and postnasal drip.  Use the Tessalon Perles every 8 hours during the day.  Take them with a small sip of water.  They may give you some numbness to the base of your tongue or a metallic taste in your mouth, this is normal.  Use the Promethazine DM cough syrup at bedtime for cough and congestion.  It will make you drowsy so do not take it during the day.  Take the Paxlobvid twice daily for 5 days for treatment of COVID-19. Hold your Advair while on this medication.  You can still take your Albuterol as needed fro shortness of breath or wheezing.  If you develop any increased shortness of breath-especially at rest, you are unable to speak in full sentences, or is a late sign your lips are turning blue you need to go the ER for evaluation.

## 2022-09-10 NOTE — ED Provider Notes (Signed)
[facility]    CSN: [id number] Arrival date & time: 09/10/22  1406      History   Chief Complaint Chief Complaint  Patient presents with   Covid Positive    HPI Andrew Elliott is a 62 y.o. male.   HPI  62 year old male here for evaluation of respiratory symptoms.  The patient reports that his symptoms began last evening with a fever, body aches, runny nose, nasal congestion, productive cough for clear sputum, and shortness of breath.  He denies any GI symptoms.  He did take a home COVID test which was positive.  He last took Tylenol at 11 AM and he is unable to take NSAIDs secondary to GERD and stomach issues.  His current temp is 102.6.  He has a significant past medical history for hypothyroidism, diabetes, asthma, acute hypoxemic respiratory failure secondary to COVID, and GERD.  Past Medical History:  Diagnosis Date   Asthma    COVID-19 09/2019   Diabetes mellitus without complication ([place])    Dyspnea    with exertion   GERD (gastroesophageal reflux disease)    Hypothyroid 2013   Pneumonia 2021    Patient Active Problem List   Diagnosis Date Noted   Acute hypoxemic respiratory failure due to COVID-19 ([place]) 09/30/2019   Chest pain 09/30/2019   Acute respiratory failure with hypoxia ([place]) 09/30/2019   Rotator cuff tendinitis, left 06/28/2018   Rupture of left distal biceps tendon 01/21/2018   Elevated alanine aminotransferase (ALT) level 06/18/2015   Hypertriglyceridemia 06/18/2015   Pre-diabetes 06/18/2015   Vitamin D deficiency 06/18/2015   Gastroesophageal reflux disease without esophagitis 03/12/2015   Hydrocele 01/30/2014   Adult hypothyroidism 12/30/2013    Past Surgical History:  Procedure Laterality Date   COLONOSCOPY WITH PROPOFOL N/A 10/19/2016   Procedure: COLONOSCOPY WITH PROPOFOL;  Surgeon: Lollie Sails, MD;  Location: [facility] ENDOSCOPY;  Service: Endoscopy;  Laterality: N/A;   DISTAL BICEPS TENDON REPAIR Left 01/23/2018    Procedure: PRIMARY DISTAL BICEPS TENDON REPAIR TENDON RUPTURE;  Surgeon: Corky Mull, MD;  Location: [place];  Service: Orthopedics;  Laterality: Left;  C ARM OR FLUOROSCAN UNIT BIOMET TOGGLE LOK DEVICE   ESOPHAGOGASTRODUODENOSCOPY (EGD) WITH PROPOFOL N/A 10/19/2016   Procedure: ESOPHAGOGASTRODUODENOSCOPY (EGD) WITH PROPOFOL;  Surgeon: Lollie Sails, MD;  Location: [facility] ENDOSCOPY;  Service: Endoscopy;  Laterality: N/A;   VIDEO BRONCHOSCOPY WITH ENDOBRONCHIAL NAVIGATION N/A 10/01/2020   Procedure: VIDEO BRONCHOSCOPY WITH ENDOBRONCHIAL NAVIGATION;  Surgeon: Ottie Glazier, MD;  Location: [facility] ORS;  Service: Thoracic;  Laterality: N/A;   VIDEO BRONCHOSCOPY WITH ENDOBRONCHIAL ULTRASOUND N/A 10/01/2020   Procedure: VIDEO BRONCHOSCOPY WITH ENDOBRONCHIAL ULTRASOUND;  Surgeon: Ottie Glazier, MD;  Location: [facility] ORS;  Service: Thoracic;  Laterality: N/A;       Home Medications    Prior to Admission medications   Medication Sig Start Date End Date Taking? Authorizing Provider  ADVAIR DISKUS 250-50 MCG/DOSE AEPB Inhale 1 puff into the lungs in the morning and at bedtime. 09/02/20  Yes [provider]  albuterol (VENTOLIN HFA) 108 (90 Base) MCG/ACT inhaler Inhale 1-2 puffs into the lungs every 4 (four) hours as needed for wheezing or shortness of breath. 09/25/19  Yes [provider]  atorvastatin (LIPITOR) 10 MG tablet Take 10 mg by mouth every evening. 08/16/20  Yes [provider]  azithromycin (ZITHROMAX) 250 MG tablet Take 1 tablet (250 mg total) by mouth daily. Take first 2 tablets together, then 1 every day until finished. 08/18/22  Yes White, Adrienne R, NP  benzonatate (TESSALON) 100 MG capsule Take 2 capsules (200 mg total) by mouth every 8 (eight) hours. 09/10/22  Yes Margarette Canada, NP  Cholecalciferol (VITAMIN D3) 50 MCG (2000 UT) TABS Take 2,000 Units by mouth daily.   Yes [provider]  diclofenac (VOLTAREN) 75 MG EC tablet Take 1 tablet (75 mg  total) by mouth 2 (two) times daily. Patient taking differently: Take 75 mg by mouth daily. 11/27/18  Yes Regal, Tamala Fothergill, DPM  ipratropium (ATROVENT) 0.06 % nasal spray Place 2 sprays into both nostrils 4 (four) times daily. 09/10/22  Yes Margarette Canada, NP  levothyroxine (SYNTHROID) 75 MCG tablet Take 75 mcg by mouth daily before breakfast. 07/18/19  Yes [provider]  metFORMIN (GLUCOPHAGE-XR) 500 MG 24 hr tablet Take 500 mg by mouth daily in the afternoon. 08/16/20  Yes [provider]  metroNIDAZOLE (FLAGYL) 250 MG tablet Take by mouth. 08/29/22 09/12/22 Yes [provider]  Multiple Vitamin (MULTIVITAMIN WITH MINERALS) TABS tablet Take 1 tablet by mouth daily. One-A-Day   Yes [provider]  nirmatrelvir/ritonavir (PAXLOVID) 20 x 150 MG & 10 x '100MG'$  TABS Take 3 tablets by mouth 2 (two) times daily for 5 days. 09/10/22 09/15/22 Yes Margarette Canada, NP  nystatin-triamcinolone (MYCOLOG II) cream Apply 1 application topically 2 (two) times daily as needed. 08/06/20  Yes [provider]  omeprazole (PRILOSEC) 20 MG capsule Take 20 mg by mouth 2 (two) times daily. 09/13/20  Yes [provider]  ondansetron (ZOFRAN ODT) 4 MG disintegrating tablet Take 1 tablet (4 mg total) by mouth every 8 (eight) hours as needed for nausea or vomiting. 11/06/20  Yes Vanessa [place], MD  promethazine-dextromethorphan (PROMETHAZINE-DM) 6.25-15 MG/5ML syrup Take 5 mLs by mouth 4 (four) times daily as needed. 09/10/22  Yes Margarette Canada, NP    Family History History reviewed. No pertinent family history.  Social History Social History   Tobacco Use   Smoking status: Never   Smokeless tobacco: Never  Vaping Use   Vaping Use: Never used  Substance Use Topics   Alcohol use: No   Drug use: No     Allergies   Nsaids and Amoxicillin   Review of Systems Review of Systems  Constitutional:  Positive for fever.  HENT:  Positive for congestion and rhinorrhea. Negative for  ear pain.   Respiratory:  Positive for cough and shortness of breath. Negative for wheezing.   Gastrointestinal:  Negative for diarrhea, nausea and vomiting.  Skin:  Negative for rash.  Hematological: Negative.   Psychiatric/Behavioral: Negative.       Physical Exam Triage Vital Signs ED Triage Vitals  Enc Vitals Group     BP 09/10/22 1504 126/75     Pulse Rate 09/10/22 1504 (!) 111     Resp 09/10/22 1504 16     Temp 09/10/22 1504 (!) 102.6 F (39.2 C)     Temp Source 09/10/22 1504 Oral     SpO2 09/10/22 1504 94 %     Weight 09/10/22 1502 202 lb (91.6 kg)     Height 09/10/22 1502 '5\' 7"'$  (1.702 m)     Head Circumference --      Peak Flow --      Pain Score 09/10/22 1502 10     Pain Loc --      Pain Edu? --      Excl. in [place]? --    No data found.  Updated Vital Signs BP 126/75 (  BP Location: Left Arm)   Pulse (!) 111   Temp (!) 102.6 F (39.2 C) (Oral)   Resp 16   Ht '5\' 7"'$  (1.702 m)   Wt 202 lb (91.6 kg)   SpO2 94%   BMI 31.64 kg/m   Visual Acuity Right Eye Distance:   Left Eye Distance:   Bilateral Distance:    Right Eye Near:   Left Eye Near:    Bilateral Near:     Physical Exam Vitals and nursing note reviewed.  Constitutional:      Appearance: Normal appearance. He is not ill-appearing.  HENT:     Head: Normocephalic and atraumatic.     Right Ear: Tympanic membrane, ear canal and external ear normal. There is no impacted cerumen.     Left Ear: Tympanic membrane, ear canal and external ear normal. There is no impacted cerumen.     Nose: Congestion and rhinorrhea present.     Comments: Nasal mucosa is erythematous and edematous with clear discharge in both nares.    Mouth/Throat:     Mouth: Mucous membranes are moist.     Pharynx: Oropharynx is clear. Posterior oropharyngeal erythema present. No oropharyngeal exudate.     Comments: Patient is erythema injection to the posterior pharynx with clear postnasal drip. Cardiovascular:     Rate and Rhythm:  Normal rate and regular rhythm.     Pulses: Normal pulses.     Heart sounds: Normal heart sounds. No murmur heard.    No friction rub. No gallop.  Pulmonary:     Effort: Pulmonary effort is normal.     Breath sounds: Normal breath sounds. No wheezing, rhonchi or rales.  Musculoskeletal:     Cervical back: Normal range of motion.  Lymphadenopathy:     Cervical: No cervical adenopathy.  Skin:    General: Skin is warm and dry.     Capillary Refill: Capillary refill takes less than 2 seconds.     Findings: No rash.  Neurological:     General: No focal deficit present.     Mental Status: He is alert and oriented to person, place, and time.  Psychiatric:        Mood and Affect: Mood normal.        Behavior: Behavior normal.        Thought Content: Thought content normal.        Judgment: Judgment normal.      UC Treatments / Results  Labs (all labs ordered are listed, but only abnormal results are displayed) Labs Reviewed - No data to display  EKG   Radiology No results found.  Procedures Procedures (including critical care time)  Medications Ordered in UC Medications - No data to display  Initial Impression / Assessment and Plan / UC Course  I have reviewed the triage vital signs and the nursing notes.  Pertinent labs & imaging results that were available during my care of the patient were reviewed by me and considered in my medical decision making (see chart for details).   Patient is a pleasant 62 year old male here for evaluation after testing COVID positive at home.  He does have a history of hypoxemic respiratory failure secondary to COVID.  This is his third time having COVID.  He does have a mildly increased work of breathing but he is able to speak in full sentences without dyspnea or tachypnea, though talking does trigger cough.  He has inflamed nasal mucosa with clear rhinorrhea and clear postnasal drip on  exam but his lung sounds are clear to auscultation in all  fields.  I will start the patient on Paxlovid twice daily for 5 days.  He has a CMP in epic from [place] system dated 07/04/2022 which shows a GFR of 85.  I will also prescribe Atrovent nasal spray to help with the congestion and drainage.  Tessalon Perles and Promethazine DM cough syrup for cough and congestion.  I have advised him to hold his Advair while he is on the Paxlovid as it will increase QT prolongation but he can continue his albuterol.  ER precautions reviewed.  Work note provided.   Final Clinical Impressions(s) / UC Diagnoses   Final diagnoses:  [id number]     Discharge Instructions      You will have to quarantine for 5 days from the start of your symptoms.  After 5 days you can break quarantine if your symptoms have improved and you have not had a fever for 24 hours without taking Tylenol or ibuprofen. You must wear a mask around others for an additional 5 days.  Use over-the-counter Tylenol as needed for body aches and fever.  Use the Atrovent nasal spray, 2 squirts in each nostril every 6 hours, as needed for runny nose and postnasal drip.  Use the Tessalon Perles every 8 hours during the day.  Take them with a small sip of water.  They may give you some numbness to the base of your tongue or a metallic taste in your mouth, this is normal.  Use the Promethazine DM cough syrup at bedtime for cough and congestion.  It will make you drowsy so do not take it during the day.  Take the Paxlobvid twice daily for 5 days for treatment of COVID-19. Hold your Advair while on this medication.  You can still take your Albuterol as needed fro shortness of breath or wheezing.  If you develop any increased shortness of breath-especially at rest, you are unable to speak in full sentences, or is a late sign your lips are turning blue you need to go the ER for evaluation.      ED Prescriptions     Medication Sig Dispense Auth. Provider   nirmatrelvir/ritonavir (PAXLOVID) 20 x 150  MG & 10 x '100MG'$  TABS Take 3 tablets by mouth 2 (two) times daily for 5 days. 30 tablet Margarette Canada, NP   benzonatate (TESSALON) 100 MG capsule Take 2 capsules (200 mg total) by mouth every 8 (eight) hours. 21 capsule Margarette Canada, NP   ipratropium (ATROVENT) 0.06 % nasal spray Place 2 sprays into both nostrils 4 (four) times daily. 15 mL Margarette Canada, NP   promethazine-dextromethorphan (PROMETHAZINE-DM) 6.25-15 MG/5ML syrup Take 5 mLs by mouth 4 (four) times daily as needed. 118 mL Margarette Canada, NP      PDMP not reviewed this encounter.   Margarette Canada, NP 09/10/22 1531

## 2022-09-10 NOTE — ED Triage Notes (Signed)
Pt c/o fever,bodyaches cough, & sob x1 day. Tested + for covid today at home. Hx of pneumonia

## 2022-09-25 ENCOUNTER — Other Ambulatory Visit: Payer: Self-pay | Admitting: Specialist

## 2022-09-25 DIAGNOSIS — D869 Sarcoidosis, unspecified: Secondary | ICD-10-CM

## 2022-09-28 ENCOUNTER — Ambulatory Visit
Admission: RE | Admit: 2022-09-28 | Discharge: 2022-09-28 | Disposition: A | Discharge status: Home / Self Care | Payer: BC Managed Care – PPO | Source: Ambulatory Visit | Attending: Specialist | Admitting: Specialist

## 2022-09-28 DIAGNOSIS — D869 Sarcoidosis, unspecified: Secondary | ICD-10-CM | POA: Diagnosis present

## 2022-09-28 MED ORDER — IOHEXOL 300 MG/ML  SOLN
75.0000 mL | Freq: Once | INTRAMUSCULAR | Status: AC | PRN
  Administered 2022-09-28: 75 mL via INTRAVENOUS

## 2022-09-29 LAB — POCT I-STAT CREATININE: Creatinine, Ser: 1.3 mg/dL — ABNORMAL HIGH (ref 0.61–1.24)

## 2022-12-01 ENCOUNTER — Ambulatory Visit
Admission: EM | Admit: 2022-12-01 | Discharge: 2022-12-01 | Disposition: A | Discharge status: Home / Self Care | Payer: BC Managed Care – PPO | Attending: Nurse Practitioner | Admitting: Nurse Practitioner

## 2022-12-01 DIAGNOSIS — M5441 Lumbago with sciatica, right side: Secondary | ICD-10-CM

## 2022-12-01 MED ORDER — CYCLOBENZAPRINE HCL 10 MG PO TABS: 10.0000 mg | ORAL_TABLET | Freq: Two times a day (BID) | ORAL | 0 refills | Status: DC | PRN

## 2022-12-01 MED ORDER — KETOROLAC TROMETHAMINE 30 MG/ML IJ SOLN
30.0000 mg | Freq: Once | INTRAMUSCULAR | Status: AC
  Administered 2022-12-01: 30 mg via INTRAMUSCULAR

## 2022-12-01 MED ORDER — PREDNISONE 10 MG (21) PO TBPK: ORAL_TABLET | Freq: Every day | ORAL | 0 refills | Status: DC

## 2022-12-01 MED ORDER — LIDOCAINE 5 % EX PTCH: 1.0000 | MEDICATED_PATCH | CUTANEOUS | 0 refills | Status: DC

## 2022-12-01 NOTE — ED Provider Notes (Signed)
[facility]    CSN: [id number] Arrival date & time: 12/01/22  1542      History   Chief Complaint Chief Complaint  Patient presents with   Back Pain    HPI Andrew Elliott is a 62 y.o. male presents for evaluation of back pain.  Patient reports 1 week of a persistent bilateral lower back pain that does radiate into his right leg.  Denies any known injury but thinks it occurred after he was going down some stairs.  Denies fall.  No numbness/tingling/weakness of his lower extremities, no bowel or bladder incontinence, no saddle paresthesia.  Denies history of low back pain/injury/surgeries in the past.  He has been taken Tylenol/caffeine/aspirin that was given by his employer.  Denies any other issues or concerns at this time.   Back Pain   Past Medical History:  Diagnosis Date   Asthma    COVID-19 09/2019   Diabetes mellitus without complication    Dyspnea    with exertion   GERD (gastroesophageal reflux disease)    Hypothyroid 2013   Pneumonia 2021    Patient Active Problem List   Diagnosis Date Noted   Acute hypoxemic respiratory failure due to COVID-19 09/30/2019   Chest pain 09/30/2019   Acute respiratory failure with hypoxia 09/30/2019   Rotator cuff tendinitis, left 06/28/2018   Rupture of left distal biceps tendon 01/21/2018   Elevated alanine aminotransferase (ALT) level 06/18/2015   Hypertriglyceridemia 06/18/2015   Pre-diabetes 06/18/2015   Vitamin D deficiency 06/18/2015   Gastroesophageal reflux disease without esophagitis 03/12/2015   Hydrocele 01/30/2014   Adult hypothyroidism 12/30/2013    Past Surgical History:  Procedure Laterality Date   COLONOSCOPY WITH PROPOFOL N/A 10/19/2016   Procedure: COLONOSCOPY WITH PROPOFOL;  Surgeon: Christena Deem, MD;  Location: [facility] ENDOSCOPY;  Service: Endoscopy;  Laterality: N/A;   DISTAL BICEPS TENDON REPAIR Left 01/23/2018   Procedure: PRIMARY DISTAL BICEPS TENDON REPAIR TENDON RUPTURE;  Surgeon:  Christena Flake, MD;  Location: [facility];  Service: Orthopedics;  Laterality: Left;  C ARM OR FLUOROSCAN UNIT BIOMET TOGGLE LOK DEVICE   ESOPHAGOGASTRODUODENOSCOPY (EGD) WITH PROPOFOL N/A 10/19/2016   Procedure: ESOPHAGOGASTRODUODENOSCOPY (EGD) WITH PROPOFOL;  Surgeon: Christena Deem, MD;  Location: [facility] ENDOSCOPY;  Service: Endoscopy;  Laterality: N/A;   VIDEO BRONCHOSCOPY WITH ENDOBRONCHIAL NAVIGATION N/A 10/01/2020   Procedure: VIDEO BRONCHOSCOPY WITH ENDOBRONCHIAL NAVIGATION;  Surgeon: Vida Rigger, MD;  Location: [facility] ORS;  Service: Thoracic;  Laterality: N/A;   VIDEO BRONCHOSCOPY WITH ENDOBRONCHIAL ULTRASOUND N/A 10/01/2020   Procedure: VIDEO BRONCHOSCOPY WITH ENDOBRONCHIAL ULTRASOUND;  Surgeon: Vida Rigger, MD;  Location: [facility] ORS;  Service: Thoracic;  Laterality: N/A;       Home Medications    Prior to Admission medications   Medication Sig Start Date End Date Taking? Authorizing Provider  ADVAIR DISKUS 250-50 MCG/DOSE AEPB Inhale 1 puff into the lungs in the morning and at bedtime. 09/02/20  Yes [provider]  albuterol (VENTOLIN HFA) 108 (90 Base) MCG/ACT inhaler Inhale 1-2 puffs into the lungs every 4 (four) hours as needed for wheezing or shortness of breath. 09/25/19  Yes [provider]  atorvastatin (LIPITOR) 10 MG tablet Take 10 mg by mouth every evening. 08/16/20  Yes [provider]  azithromycin (ZITHROMAX) 250 MG tablet Take 1 tablet (250 mg total) by mouth daily. Take first 2 tablets together, then 1 every day until finished. 08/18/22  Yes White, Adrienne R, NP  benzonatate (TESSALON) 100 MG capsule Take  2 capsules (200 mg total) by mouth every 8 (eight) hours. 09/10/22  Yes Becky [place], NP  Cholecalciferol (VITAMIN D3) 50 MCG (2000 UT) TABS Take 2,000 Units by mouth daily.   Yes [provider]  cyclobenzaprine (FLEXERIL) 10 MG tablet Take 1 tablet (10 mg total) by mouth 2 (two) times daily as needed for muscle spasms.  12/01/22  Yes Radford Pax, NP  diclofenac (VOLTAREN) 75 MG EC tablet Take 1 tablet (75 mg total) by mouth 2 (two) times daily. Patient taking differently: Take 75 mg by mouth daily. 11/27/18  Yes Regal, Kirstie Peri, DPM  ipratropium (ATROVENT) 0.06 % nasal spray Place 2 sprays into both nostrils 4 (four) times daily. 09/10/22  Yes Becky [place], NP  levothyroxine (SYNTHROID) 75 MCG tablet Take 75 mcg by mouth daily before breakfast. 07/18/19  Yes [provider]  lidocaine (LIDODERM) 5 % Place 1 patch onto the skin daily. Remove & Discard patch within 12 hours or as directed by MD 12/01/22  Yes Radford Pax, NP  metFORMIN (GLUCOPHAGE-XR) 500 MG 24 hr tablet Take 500 mg by mouth daily in the afternoon. 08/16/20  Yes [provider]  Multiple Vitamin (MULTIVITAMIN WITH MINERALS) TABS tablet Take 1 tablet by mouth daily. One-A-Day   Yes [provider]  nystatin-triamcinolone (MYCOLOG II) cream Apply 1 application topically 2 (two) times daily as needed. 08/06/20  Yes [provider]  omeprazole (PRILOSEC) 20 MG capsule Take 20 mg by mouth 2 (two) times daily. 09/13/20  Yes [provider]  ondansetron (ZOFRAN ODT) 4 MG disintegrating tablet Take 1 tablet (4 mg total) by mouth every 8 (eight) hours as needed for nausea or vomiting. 11/06/20  Yes Concha Se, MD  predniSONE (STERAPRED UNI-PAK 21 TAB) 10 MG (21) TBPK tablet Take by mouth daily. Take 6 tabs by mouth daily  for 1 day, then 5 tabs for 1 day, then 4 tabs for 1 day, then 3 tabs for 1 day, 2 tabs for 1 day, then 1 tab by mouth daily for 1 days 12/01/22  Yes Radford Pax, NP  promethazine-dextromethorphan (PROMETHAZINE-DM) 6.25-15 MG/5ML syrup Take 5 mLs by mouth 4 (four) times daily as needed. 09/10/22  Yes Becky [place], NP    Family History History reviewed. No pertinent family history.  Social History Social History   Tobacco Use   Smoking status: Never   Smokeless tobacco: Never  Vaping Use    Vaping Use: Never used  Substance Use Topics   Alcohol use: No   Drug use: No     Allergies   Nsaids and Amoxicillin   Review of Systems Review of Systems  Musculoskeletal:  Positive for back pain.     Physical Exam Triage Vital Signs ED Triage Vitals  Enc Vitals Group     BP 12/01/22 1547 126/77     Pulse Rate 12/01/22 1547 76     Resp 12/01/22 1547 16     Temp 12/01/22 1547 98.3 F (36.8 C)     Temp Source 12/01/22 1547 Oral     SpO2 12/01/22 1547 96 %     Weight 12/01/22 1546 205 lb (93 kg)     Height 12/01/22 1546 5\' 7"  (1.702 m)     Head Circumference --      Peak Flow --      Pain Score 12/01/22 1550 10     Pain Loc --      Pain Edu? --  Excl. in [place]? --    No data found.  Updated Vital Signs BP 126/77 (BP Location: Left Arm)   Pulse 76   Temp 98.3 F (36.8 C) (Oral)   Resp 16   Ht 5\' 7"  (1.702 m)   Wt 205 lb (93 kg)   SpO2 96%   BMI 32.11 kg/m   Visual Acuity Right Eye Distance:   Left Eye Distance:   Bilateral Distance:    Right Eye Near:   Left Eye Near:    Bilateral Near:     Physical Exam Vitals and nursing note reviewed.  Constitutional:      Appearance: Normal appearance.  HENT:     Head: Normocephalic and atraumatic.  Eyes:     Pupils: Pupils are equal, round, and reactive to light.  Cardiovascular:     Rate and Rhythm: Normal rate.  Pulmonary:     Effort: Pulmonary effort is normal.  Musculoskeletal:     Lumbar back: Spasms and tenderness present. No swelling, edema, deformity, signs of trauma, lacerations or bony tenderness. Normal range of motion. Positive right straight leg raise test. Negative left straight leg raise test. No scoliosis.       Back:     Comments: Tender to palpation to bilateral paralumbar spinal muscles.  Positive straight leg raise on right.  Strength 5 out of 5 bilateral lower extremities.  Skin:    General: Skin is warm and dry.  Neurological:     General: No focal deficit present.     Mental  Status: He is alert and oriented to person, place, and time.     Deep Tendon Reflexes:     Reflex Scores:      Patellar reflexes are 2+ on the right side and 2+ on the left side. Psychiatric:        Mood and Affect: Mood normal.        Behavior: Behavior normal.      UC Treatments / Results  Labs (all labs ordered are listed, but only abnormal results are displayed) Labs Reviewed - No data to display  EKG   Radiology No results found.  Procedures Procedures (including critical care time)  Medications Ordered in UC Medications  ketorolac (TORADOL) 30 MG/ML injection 30 mg (30 mg Intramuscular Given 12/01/22 1642)    Initial Impression / Assessment and Plan / UC Course  I have reviewed the triage vital signs and the nursing notes.  Pertinent labs & imaging results that were available during my care of the patient were reviewed by me and considered in my medical decision making (see chart for details).     Patient given Toradol injection in clinic.  Monitored for 10 minutes after injection with no reaction noted and tolerated well.  Patient reports improved pain after Toradol injection.  He was instructed no NSAIDs for 24 hours and he verbalized understanding Lidoderm patch as needed Flexeril as needed.  Side effect profile reviewed Prednisone taper as prescribed Heat and rest PCP follow-up if symptoms do not improve ER precautions reviewed and patient verbalized understanding Final Clinical Impressions(s) / UC Diagnoses   Final diagnoses:  Acute bilateral low back pain with right-sided sciatica     Discharge Instructions      You were given a Toradol injection in clinic today. Do not take any over the counter NSAID's such as Advil, ibuprofen, Aleve, or naproxen for 24 hours.  You may take tylenol if needed And is on as prescribed Lidoderm patch to the low  back as needed.  Leave on for 12 hours and take off for 12 hours Flexeril as needed.  Please note this  medication can make you drowsy.  Do not drink alcohol or drive while on this medication Heat to the back as needed Rest Follow-up with your PCP if your symptoms do not improve Please go to the ER for any worsening symptoms      ED Prescriptions     Medication Sig Dispense Auth. Provider   lidocaine (LIDODERM) 5 % Place 1 patch onto the skin daily. Remove & Discard patch within 12 hours or as directed by MD 15 patch Radford Pax, NP   predniSONE (STERAPRED UNI-PAK 21 TAB) 10 MG (21) TBPK tablet Take by mouth daily. Take 6 tabs by mouth daily  for 1 day, then 5 tabs for 1 day, then 4 tabs for 1 day, then 3 tabs for 1 day, 2 tabs for 1 day, then 1 tab by mouth daily for 1 days 21 tablet Radford Pax, NP   cyclobenzaprine (FLEXERIL) 10 MG tablet Take 1 tablet (10 mg total) by mouth 2 (two) times daily as needed for muscle spasms. 10 tablet Radford Pax, NP      PDMP not reviewed this encounter.   Radford Pax, NP 12/01/22 [phone]

## 2022-12-01 NOTE — Discharge Instructions (Addendum)
You were given a Toradol injection in clinic today. Do not take any over the counter NSAID's such as Advil, ibuprofen, Aleve, or naproxen for 24 hours.  You may take tylenol if needed And is on as prescribed Lidoderm patch to the low back as needed.  Leave on for 12 hours and take off for 12 hours Flexeril as needed.  Please note this medication can make you drowsy.  Do not drink alcohol or drive while on this medication Heat to the back as needed Rest Follow-up with your PCP if your symptoms do not improve Please go to the ER for any worsening symptoms

## 2022-12-01 NOTE — ED Triage Notes (Signed)
Pt c/o R sided lower back pain x1 wk. States he was lowering gravel & as he was lowering it he felt something pull.

## 2023-02-21 ENCOUNTER — Ambulatory Visit (INDEPENDENT_AMBULATORY_CARE_PROVIDER_SITE_OTHER): Payer: BC Managed Care – PPO

## 2023-02-21 ENCOUNTER — Encounter: Payer: Self-pay | Admitting: Podiatry

## 2023-02-21 ENCOUNTER — Ambulatory Visit: Payer: BC Managed Care – PPO | Admitting: Podiatry

## 2023-02-21 DIAGNOSIS — M722 Plantar fascial fibromatosis: Secondary | ICD-10-CM | POA: Diagnosis not present

## 2023-02-21 MED ORDER — TRIAMCINOLONE ACETONIDE 10 MG/ML IJ SUSP
10.0000 mg | Freq: Once | INTRAMUSCULAR | Status: AC
  Administered 2023-02-21: 10 mg

## 2023-02-21 NOTE — Patient Instructions (Signed)

## 2023-02-22 ENCOUNTER — Telehealth: Payer: Self-pay | Admitting: Podiatry

## 2023-02-22 MED ORDER — PREDNISONE 10 MG PO TABS: ORAL_TABLET | ORAL | 0 refills | Status: DC

## 2023-02-22 NOTE — Progress Notes (Signed)
Subjective:   Patient ID: Andrew Elliott, male   DOB: 62 y.o.   MRN: [id number]   HPI Patient presents with pain in the left heel of significant nature stating it had done well for the last 3 years.  Patient does not smoke likes to be active   Review of Systems  All other systems reviewed and are negative.       Objective:  Physical Exam Vitals and nursing note reviewed.  Constitutional:      Appearance: He is well-developed.  Pulmonary:     Effort: Pulmonary effort is normal.  Musculoskeletal:        General: Normal range of motion.  Skin:    General: Skin is warm.  Neurological:     Mental Status: He is alert.     Neurovascular status intact muscle strength adequate range of motion adequate exquisite discomfort noted medial fascial band left insertional point tendon calcaneus fluid buildup around the medial band     Assessment:  Significant reoccurrence Planter fasciitis left heel at the insertional point tendon calcaneus     Plan:  H&P reviewed and went ahead today did sterile prep and injected the plantar fascia at insertion 3 mg Kenalog 5 mg Xylocaine and applied fascial brace to lift up the arch gave instructions for ice he is good to be out of Sterapred DS Dosepak and will be seen back in 3 weeks.  All questions answered  X-rays indicate small spur no indication stress fracture or arthritis

## 2023-02-22 NOTE — Telephone Encounter (Signed)
Dr. Charlsie Merles Rx Medrol Dose pack.  Patient was informed that it was just sent in.

## 2023-02-22 NOTE — Telephone Encounter (Signed)
Pt 's daughter stated that he is still in a lot of pain from PF & wanted to know if a Rx for pain could be sent in. Please advise

## 2023-03-14 ENCOUNTER — Encounter: Payer: Self-pay | Admitting: Podiatry

## 2023-03-14 ENCOUNTER — Ambulatory Visit: Payer: BC Managed Care – PPO | Admitting: Podiatry

## 2023-03-14 DIAGNOSIS — M722 Plantar fascial fibromatosis: Secondary | ICD-10-CM

## 2023-03-14 MED ORDER — TRIAMCINOLONE ACETONIDE 10 MG/ML IJ SUSP
10.0000 mg | Freq: Once | INTRAMUSCULAR | Status: AC
  Administered 2023-03-14: 10 mg via INTRA_ARTICULAR

## 2023-03-15 NOTE — Progress Notes (Signed)
Subjective:   Patient ID: Andrew Elliott, male   DOB: 62 y.o.   MRN: [id number]   HPI Patient presents still having a lot of pain in the left heel more proximal than it was and patient has been wearing the brace up to this point but has pain when he gets up in the morning after periods of sitting   ROS      Objective:  Physical Exam  Neurovascular status intact exquisite discomfort plantar aspect left heel more proximal within the fascial band and into the plantar aspect of the heel bone     Assessment:  Acute Planter fasciitis left     Plan:  H&P reviewed at this point I dispensed night splint with all instructions on usage fitted properly to his lower leg I then did sterile prep injected the plantar fascia 3 mg Kenalog 5 mg Xylocaine I want him to use aggressive ice continue oral anti-inflammatories will be seen back 3 weeks

## 2023-04-11 ENCOUNTER — Ambulatory Visit: Payer: BC Managed Care – PPO | Admitting: Podiatry

## 2023-04-11 ENCOUNTER — Encounter: Payer: Self-pay | Admitting: Podiatry

## 2023-04-11 DIAGNOSIS — M722 Plantar fascial fibromatosis: Secondary | ICD-10-CM

## 2023-04-11 DIAGNOSIS — M7662 Achilles tendinitis, left leg: Secondary | ICD-10-CM | POA: Diagnosis not present

## 2023-04-11 MED ORDER — TRIAMCINOLONE ACETONIDE 10 MG/ML IJ SUSP
10.0000 mg | Freq: Once | INTRAMUSCULAR | Status: AC
  Administered 2023-04-11: 10 mg via INTRA_ARTICULAR

## 2023-04-11 NOTE — Progress Notes (Signed)
Subjective:   Patient ID: Andrew Elliott, male   DOB: 62 y.o.   MRN: [id number]   HPI Patient presents with caregiver stating still having a lot of pain in the heel but is more now in the back than it is in the bottom   ROS      Objective:  Physical Exam  Neurovascular status intact with inflammation out of the posterior Achilles lateral side very painful when pressed     Assessment:  Inflammatory capsulitis and Achilles tendinitis left with also history of fasciitis     Plan:  H&P reviewed at this point I discussed injection explained risk of injection he wants to have this done and I did sterile prep and injected the posterior lateral aspect of the Achilles staying away central medial 3 mg dexamethasone Kenalog 5 mg Xylocaine and applied air fracture walker to completely immobilize.  Patient will be seen back in 3 weeks and hopefully this will eradicate the symptoms

## 2023-05-03 ENCOUNTER — Ambulatory Visit: Payer: BC Managed Care – PPO | Admitting: Podiatry

## 2023-05-14 ENCOUNTER — Encounter: Payer: Self-pay | Admitting: Podiatry

## 2023-05-14 ENCOUNTER — Ambulatory Visit: Payer: BC Managed Care – PPO | Admitting: Podiatry

## 2023-05-14 DIAGNOSIS — M7662 Achilles tendinitis, left leg: Secondary | ICD-10-CM

## 2023-05-14 NOTE — Progress Notes (Signed)
Subjective:   Patient ID: Zenaida Niece, male   DOB: 62 y.o.   MRN: [id number]   HPI Patient presents stating that he still has pain in the back but it is improving over where it was before   ROS      Objective:  Physical Exam  Neurovascular status intact negative Denna Haggard' sign noted patient's posterior left heel is improved mild discomfort still upon deep palpation plantar heels doing great     Assessment:  Achilles tendinitis left improving but he is not able to wear his boot all the time      Plan:  Reviewed and recommended the same treatment as before along with ice therapy and reappoint to recheck if symptoms persist

## 2023-06-13 ENCOUNTER — Encounter: Payer: Self-pay | Admitting: Podiatry

## 2023-06-13 ENCOUNTER — Ambulatory Visit (INDEPENDENT_AMBULATORY_CARE_PROVIDER_SITE_OTHER): Payer: BC Managed Care – PPO | Admitting: Podiatry

## 2023-06-13 DIAGNOSIS — M7662 Achilles tendinitis, left leg: Secondary | ICD-10-CM

## 2023-06-13 MED ORDER — TRIAMCINOLONE ACETONIDE 10 MG/ML IJ SUSP
10.0000 mg | Freq: Once | INTRAMUSCULAR | Status: AC
  Administered 2023-06-13: 10 mg via INTRA_ARTICULAR

## 2023-06-14 NOTE — Progress Notes (Signed)
Subjective:   Patient ID: Andrew Elliott, male   DOB: 62 y.o.   MRN: [id number]   HPI Patient states he is improved but he is getting an area that is just come back on the back of the left heel and he like to try to get rid of it as it makes it hard to work   ROS      Objective:  Physical Exam  Neurovascular status intact plantar fascial inflammation reduced but has discomfort in the lateral aspect of the Achilles tendon insertion with fluid buildup around that 1 area     Assessment:  Continues to have 1 area of Achilles tendinitis left     Plan:  H&P reviewed went ahead today did sterile prep and after explaining to him again the chances for rupture and that we are trying to avoid surgery on the tendon I carefully injected lateral side 3 mg Dexasone Kenalog 5 mg Xylocaine and advised for him to wear his boot and continue aggressive ice therapy.  Patient will be seen back as symptoms indicate may eventually need surgery or we could possibly try PRP injection shockwave

## 2023-07-25 ENCOUNTER — Ambulatory Visit: Payer: BC Managed Care – PPO | Admitting: Podiatry

## 2023-07-30 ENCOUNTER — Other Ambulatory Visit: Payer: Self-pay | Admitting: Surgery

## 2023-07-30 DIAGNOSIS — M7582 Other shoulder lesions, left shoulder: Secondary | ICD-10-CM

## 2023-07-30 DIAGNOSIS — M25512 Pain in left shoulder: Secondary | ICD-10-CM

## 2023-07-30 DIAGNOSIS — S46212S Strain of muscle, fascia and tendon of other parts of biceps, left arm, sequela: Secondary | ICD-10-CM

## 2023-07-30 DIAGNOSIS — S46912A Strain of unspecified muscle, fascia and tendon at shoulder and upper arm level, left arm, initial encounter: Secondary | ICD-10-CM

## 2023-08-03 ENCOUNTER — Encounter: Payer: Self-pay | Admitting: Surgery

## 2023-08-11 ENCOUNTER — Ambulatory Visit
Admission: RE | Admit: 2023-08-11 | Discharge: 2023-08-11 | Disposition: A | Discharge status: Home / Self Care | Payer: BC Managed Care – PPO | Source: Ambulatory Visit | Attending: Surgery | Admitting: Surgery

## 2023-08-11 DIAGNOSIS — S46212S Strain of muscle, fascia and tendon of other parts of biceps, left arm, sequela: Secondary | ICD-10-CM

## 2023-08-11 DIAGNOSIS — M25512 Pain in left shoulder: Secondary | ICD-10-CM

## 2023-08-11 DIAGNOSIS — M7582 Other shoulder lesions, left shoulder: Secondary | ICD-10-CM

## 2023-08-11 DIAGNOSIS — S46912A Strain of unspecified muscle, fascia and tendon at shoulder and upper arm level, left arm, initial encounter: Secondary | ICD-10-CM

## 2023-08-15 DIAGNOSIS — S46212A Strain of muscle, fascia and tendon of other parts of biceps, left arm, initial encounter: Secondary | ICD-10-CM

## 2023-08-15 DIAGNOSIS — M7582 Other shoulder lesions, left shoulder: Secondary | ICD-10-CM

## 2023-08-15 HISTORY — DX: Strain of muscle, fascia and tendon of other parts of biceps, left arm, initial encounter: S46.212A

## 2023-08-15 HISTORY — DX: Other shoulder lesions, left shoulder: M75.82

## 2023-10-06 ENCOUNTER — Ambulatory Visit
Admission: EM | Admit: 2023-10-06 | Discharge: 2023-10-06 | Disposition: A | Discharge status: Home / Self Care | Payer: BC Managed Care – PPO | Attending: Family Medicine | Admitting: Family Medicine

## 2023-10-06 ENCOUNTER — Encounter: Payer: Self-pay | Admitting: Emergency Medicine

## 2023-10-06 DIAGNOSIS — K529 Noninfective gastroenteritis and colitis, unspecified: Secondary | ICD-10-CM | POA: Diagnosis present

## 2023-10-06 DIAGNOSIS — R1084 Generalized abdominal pain: Secondary | ICD-10-CM | POA: Insufficient documentation

## 2023-10-06 DIAGNOSIS — J069 Acute upper respiratory infection, unspecified: Secondary | ICD-10-CM | POA: Diagnosis present

## 2023-10-06 LAB — RESP PANEL BY RT-PCR (FLU A&B, COVID) ARPGX2
Influenza A by PCR: NEGATIVE
Influenza B by PCR: NEGATIVE
SARS Coronavirus 2 by RT PCR: NEGATIVE

## 2023-10-06 LAB — GLUCOSE, CAPILLARY: Glucose-Capillary: 133 mg/dL — ABNORMAL HIGH (ref 70–99)

## 2023-10-06 MED ORDER — IBUPROFEN 600 MG PO TABS: 600.0000 mg | ORAL_TABLET | Freq: Four times a day (QID) | ORAL | 0 refills | Status: DC | PRN

## 2023-10-06 MED ORDER — ONDANSETRON HCL 4 MG/2ML IJ SOLN
4.0000 mg | Freq: Once | INTRAMUSCULAR | Status: AC
  Administered 2023-10-06: 4 mg via INTRAMUSCULAR

## 2023-10-06 MED ORDER — IBUPROFEN 800 MG PO TABS
800.0000 mg | ORAL_TABLET | Freq: Once | ORAL | Status: AC
  Administered 2023-10-06: 800 mg via ORAL

## 2023-10-06 MED ORDER — ONDANSETRON 4 MG PO TBDP: 4.0000 mg | ORAL_TABLET | Freq: Three times a day (TID) | ORAL | 0 refills | Status: DC | PRN

## 2023-10-06 NOTE — ED Triage Notes (Signed)
 Patient reports abdominal pain and diarrhea that started yesterday.  Patient unsure of fevers.  Patient reports cough and headache that started last night.

## 2023-10-06 NOTE — ED Provider Notes (Signed)
 [facility]    CSN: [id number] Arrival date & time: 10/06/23  1306      History   Chief Complaint Chief Complaint  Patient presents with   Abdominal Pain    HPI Andrew Elliott is a 62 y.o. male.   HPI  A Spanish interpreter was used for this encounter:  Name: Andrew Elliott #[id number]  History obtained from the patient. Andrew Elliott presents for crampy abdominal pain, diarrhea, body aches, chills, cough, headache that started yesterday. Took Pepto bismol for his stomach pain and Tylenol for the body aches without relief. Has been nauseous and even vomited last night.   People at his job have COVID.       Past Medical History:  Diagnosis Date   Asthma    COVID-19 09/2019   Diabetes mellitus without complication (HCC)    Dyspnea    with exertion   GERD (gastroesophageal reflux disease)    Hypothyroid 2013   Pneumonia 2021    Patient Active Problem List   Diagnosis Date Noted   Acute hypoxemic respiratory failure due to COVID-19 (HCC) 09/30/2019   Chest pain 09/30/2019   Acute respiratory failure with hypoxia (HCC) 09/30/2019   Rotator cuff tendinitis, left 06/28/2018   Rupture of left distal biceps tendon 01/21/2018   Elevated alanine aminotransferase (ALT) level 06/18/2015   Hypertriglyceridemia 06/18/2015   Pre-diabetes 06/18/2015   Vitamin D deficiency 06/18/2015   Gastroesophageal reflux disease without esophagitis 03/12/2015   Hydrocele 01/30/2014   Adult hypothyroidism 12/30/2013    Past Surgical History:  Procedure Laterality Date   COLONOSCOPY WITH PROPOFOL N/A 10/19/2016   Procedure: COLONOSCOPY WITH PROPOFOL;  Surgeon: Christena Deem, MD;  Location: [facility] ENDOSCOPY;  Service: Endoscopy;  Laterality: N/A;   DISTAL BICEPS TENDON REPAIR Left 01/23/2018   Procedure: PRIMARY DISTAL BICEPS TENDON REPAIR TENDON RUPTURE;  Surgeon: Christena Flake, MD;  Location: [facility];  Service: Orthopedics;  Laterality: Left;  C ARM OR FLUOROSCAN  UNIT BIOMET TOGGLE LOK DEVICE   ESOPHAGOGASTRODUODENOSCOPY (EGD) WITH PROPOFOL N/A 10/19/2016   Procedure: ESOPHAGOGASTRODUODENOSCOPY (EGD) WITH PROPOFOL;  Surgeon: Christena Deem, MD;  Location: [facility] ENDOSCOPY;  Service: Endoscopy;  Laterality: N/A;   VIDEO BRONCHOSCOPY WITH ENDOBRONCHIAL NAVIGATION N/A 10/01/2020   Procedure: VIDEO BRONCHOSCOPY WITH ENDOBRONCHIAL NAVIGATION;  Surgeon: Vida Rigger, MD;  Location: [facility] ORS;  Service: Thoracic;  Laterality: N/A;   VIDEO BRONCHOSCOPY WITH ENDOBRONCHIAL ULTRASOUND N/A 10/01/2020   Procedure: VIDEO BRONCHOSCOPY WITH ENDOBRONCHIAL ULTRASOUND;  Surgeon: Vida Rigger, MD;  Location: [facility] ORS;  Service: Thoracic;  Laterality: N/A;       Home Medications    Prior to Admission medications   Medication Sig Start Date End Date Taking? Authorizing Provider  ibuprofen (ADVIL) 600 MG tablet Take 1 tablet (600 mg total) by mouth every 6 (six) hours as needed. 10/06/23  Yes Nykiah Ma, DO  ondansetron (ZOFRAN-ODT) 4 MG disintegrating tablet Take 1 tablet (4 mg total) by mouth every 8 (eight) hours as needed. 10/06/23  Yes Maekayla Giorgio, DO  ADVAIR DISKUS 250-50 MCG/DOSE AEPB Inhale 1 puff into the lungs in the morning and at bedtime. 09/02/20   [provider]  albuterol (VENTOLIN HFA) 108 (90 Base) MCG/ACT inhaler Inhale 1-2 puffs into the lungs every 4 (four) hours as needed for wheezing or shortness of breath. 09/25/19   [provider]  atorvastatin (LIPITOR) 10 MG tablet Take 10 mg by mouth every evening. 08/16/20   [provider]  Cholecalciferol (VITAMIN D3) 50 MCG (  2000 UT) TABS Take 2,000 Units by mouth daily.    [provider]  cyclobenzaprine (FLEXERIL) 10 MG tablet Take 1 tablet (10 mg total) by mouth 2 (two) times daily as needed for muscle spasms. 12/01/22   Radford Pax, NP  levothyroxine (SYNTHROID) 75 MCG tablet Take 75 mcg by mouth daily before breakfast. 07/18/19   [provider]   lidocaine (LIDODERM) 5 % Place 1 patch onto the skin daily. Remove & Discard patch within 12 hours or as directed by MD 12/01/22   Radford Pax, NP  metFORMIN (GLUCOPHAGE-XR) 500 MG 24 hr tablet Take 500 mg by mouth daily in the afternoon. 08/16/20   [provider]  Multiple Vitamin (MULTIVITAMIN WITH MINERALS) TABS tablet Take 1 tablet by mouth daily. One-A-Day    [provider]  nystatin-triamcinolone (MYCOLOG II) cream Apply 1 application topically 2 (two) times daily as needed. 08/06/20   [provider]  omeprazole (PRILOSEC) 20 MG capsule Take 20 mg by mouth 2 (two) times daily. 09/13/20   [provider]  promethazine-dextromethorphan (PROMETHAZINE-DM) 6.25-15 MG/5ML syrup Take 5 mLs by mouth 4 (four) times daily as needed. 09/10/22   Becky Augusta, NP    Family History History reviewed. No pertinent family history.  Social History Social History   Tobacco Use   Smoking status: Never   Smokeless tobacco: Never  Vaping Use   Vaping status: Never Used  Substance Use Topics   Alcohol use: No   Drug use: No     Allergies   Nsaids and Amoxicillin   Review of Systems Review of Systems: negative unless otherwise stated in HPI.      Physical Exam Triage Vital Signs ED Triage Vitals  Encounter Vitals Group     BP 10/06/23 1457 118/85     Systolic BP Percentile --      Diastolic BP Percentile --      Pulse Rate 10/06/23 1457 (!) 102     Resp 10/06/23 1457 15     Temp 10/06/23 1457 98.5 F (36.9 C)     Temp Source 10/06/23 1457 Oral     SpO2 10/06/23 1457 95 %     Weight 10/06/23 1454 205 lb 0.4 oz (93 kg)     Height 10/06/23 1454 5\' 7"  (1.702 m)     Head Circumference --      Peak Flow --      Pain Score 10/06/23 1454 9     Pain Loc --      Pain Education --      Exclude from Growth Chart --    No data found.  Updated Vital Signs BP 118/85 (BP Location: Right Arm)   Pulse (!) 102   Temp 98.5 F (36.9 C) (Oral)   Resp 15    Ht 5\' 7"  (1.702 m)   Wt 93 kg   SpO2 95%   BMI 32.11 kg/m   Visual Acuity Right Eye Distance:   Left Eye Distance:   Bilateral Distance:    Right Eye Near:   Left Eye Near:    Bilateral Near:     Physical Exam GEN:     alert, non-toxic appearing male in no distress    HENT:  mucus membranes moist, oropharyngeal without lesions or erythema, no tonsillar hypertrophy or exudates, no nasal discharge EYES:   no scleral icterus, injection or discharge RESP:  no increased work of breathing, clear to auscultation bilaterally CVS:   regular rate and rhythm  ABD:   Soft, nontender, nondistended, no guarding, no rebound, active bowel sounds throughout, negative McBurney's, negative Murphy's Skin:   warm and dry, no rash on visible skin    UC Treatments / Results  Labs (all labs ordered are listed, but only abnormal results are displayed) Labs Reviewed  GLUCOSE, CAPILLARY - Abnormal; Notable for the following components:      Result Value   Glucose-Capillary 133 (*)    All other components within normal limits  RESP PANEL BY RT-PCR (FLU A&B, COVID) [id number]  CBG MONITORING, ED    EKG   Radiology No results found.  Procedures Procedures (including critical care time)  Medications Ordered in UC Medications  ondansetron (ZOFRAN) injection 4 mg (4 mg Intramuscular Given 10/06/23 1533)  ibuprofen (ADVIL) tablet 800 mg (800 mg Oral Given 10/06/23 1605)    Initial Impression / Assessment and Plan / UC Course  I have reviewed the triage vital signs and the nursing notes.  Pertinent labs & imaging results that were available during my care of the patient were reviewed by me and considered in my medical decision making (see chart for details).       Pt is a 63 y.o. male who presents for 1 days of respiratory symptoms. Smaran is afebrile here.  Zofran 4 mg IM and ibuprofen 800 mg given.  Satting well on room air. Overall pt is non-toxic appearing, well hydrated, without respiratory  distress. Pulmonary exam is unremarkable.  COVID and influenza panel obtained and was negative.  His CBG here was 133.  Doubt acute DKA.  History consistent with  viral  illness. Discussed symptomatic treatment.  Explained lack of efficacy of antibiotics in viral illness.  Typical duration of symptoms discussed.  Advised patient to take his GERD medicines while taking ibuprofen.  Zofran prescribed.  Strict ED precautions given and voiced understanding. Discussed MDM, treatment plan and plan for follow-up with patient who agrees with plan.     Final Clinical Impressions(s) / UC Diagnoses   Final diagnoses:  Generalized abdominal pain  Gastroenteritis  URI with cough and congestion     Discharge Instructions      Pase por la farmacia para recoger sus recetas.  Si el dolor empeora o no mejora, acuda al servicio de urgencias del hospital. Puede usar Loperamida (Imodium) si tiene ms de 10 episodios de diarrea.  Stop by the pharmacy to pick up your prescriptions.  If pain worsens or doesn't improve, go to the hospital emergency department. You can use Loperamide (Imodium), if you have more 10 episodes of diarrhea.      ED Prescriptions     Medication Sig Dispense Auth. Provider   ondansetron (ZOFRAN-ODT) 4 MG disintegrating tablet Take 1 tablet (4 mg total) by mouth every 8 (eight) hours as needed. 20 tablet Javonna Balli, DO   ibuprofen (ADVIL) 600 MG tablet Take 1 tablet (600 mg total) by mouth every 6 (six) hours as needed. 30 tablet Katha Cabal, DO      PDMP not reviewed this encounter.   Katha Cabal, DO 10/08/23 [phone]

## 2023-10-06 NOTE — Discharge Instructions (Signed)
 Pase por la farmacia para recoger sus recetas.  Si el dolor empeora o no mejora, acuda al servicio de urgencias del hospital. Puede usar Loperamida (Imodium) si tiene ms de 10 episodios de diarrea.  Stop by the pharmacy to pick up your prescriptions.  If pain worsens or doesn't improve, go to the hospital emergency department. You can use Loperamide (Imodium), if you have more 10 episodes of diarrhea.

## 2023-10-13 HISTORY — PX: SHOULDER SURGERY: SHX246

## 2023-10-23 ENCOUNTER — Other Ambulatory Visit: Payer: Self-pay | Admitting: Surgery

## 2023-10-24 ENCOUNTER — Inpatient Hospital Stay: Admission: RE | Admit: 2023-10-24 | Source: Ambulatory Visit

## 2023-10-26 ENCOUNTER — Encounter
Admission: RE | Admit: 2023-10-26 | Discharge: 2023-10-26 | Disposition: A | Discharge status: Home / Self Care | Source: Ambulatory Visit | Attending: Surgery | Admitting: Surgery

## 2023-10-26 ENCOUNTER — Other Ambulatory Visit: Payer: Self-pay

## 2023-10-26 DIAGNOSIS — E119 Type 2 diabetes mellitus without complications: Secondary | ICD-10-CM

## 2023-10-26 HISTORY — DX: Benign neoplasm of colon, unspecified: D12.6

## 2023-10-26 HISTORY — DX: Personal history of urinary calculi: Z87.442

## 2023-10-26 HISTORY — DX: Sarcoidosis, unspecified: D86.9

## 2023-10-26 NOTE — Patient Instructions (Addendum)
 Su procedimiento est programado para el mircoles 19/03/25. Presntese en el mostrador de registro, [employer] en Information systems manager del [employer] Para conocer su hora de llegada, llame al [phone] entre la 1 p. m. y las 3 p. m. Cherrie Distance 18/03/25. Si su hora de llegada es a las 6:00 a. m., no llegue antes, ya que las puertas de [place] del [address] no abren [employer] 6:00 a. m.  RECUERDE: El incumplimiento de las instrucciones puede resultar en un riesgo mdico grave, que puede incluir la [place]; o, a discrecin de su cirujano y Scientific laboratory technician, su ciruga podra tener que reprogramarse.  No consuma alimentos despus de la medianoche anterior a la ciruga.  No mastique chicle ni caramelos duros.  Puede beber agua hasta 2 horas antes de su hora de llegada programada para la [place]. No beba nada dentro de las 2 horas previas a su hora de llegada programada.  Los lquidos claros incluyen: - Agua  Adems, su mdico le ha indicado beber lo siguiente: Gatorade G2 Product manager esta bebida con carbohidratos [employer] horas antes de la ciruga ayuda a reducir la resistencia a la insulina y a [employer] del [place]. Por favor, termine de beberla 2 horas antes de la hora de llegada programada. Una semana antes de la ciruga: Suspenda los antiinflamatorios (AINE) como Advil, Aleve, ibuprofeno, Motrin, naproxeno, Naprosyn y productos a base de aspirina como Excedrin, Goody's Powder y [employer]. Puede tomar Tylenol si lo necesita para Marketing executive de la [place].  Suspenda cualquier suplemento de venta libre hasta despus de la ciruga: vitamina D3, multivitamnico.  Suspenda la metformina (Glucophage-XR) a partir del 17/10/2023.  El da de la [place], tome solo estos medicamentos con pequeos tragos de agua:  1. Advair Diskus 2. Levotiroxina (Synthroid) 3. Omeprazol (Prilosec)  Use inhaladores el da de la ciruga y trigalos al hospital.  No consuma alcohol durante las 24  horas anteriores o posteriores a la [place].  No fumar, incluidos los cigarrillos electrnicos, durante las 24 horas previas a la [place].  No consumir tabaco masticable durante al menos 6 horas antes de la [place].  No usar parches de Optometrist de la [place].  No consumir drogas recreativas durante al menos una semana (preferiblemente 2 semanas) antes de la ciruga.  Tenga en cuenta que la combinacin de cocana y anestesia puede tener consecuencias negativas, incluso la [place]. Si el resultado de la prueba de cocana es positivo, se cancelar la ciruga.  La maana de la ciruga, cepllese los dientes con pasta dental y agua; puede enjuagarse la boca con enjuague bucal si lo desea. No ingiera pasta dental ni enjuague bucal. Use jabn CHG o toallitas segn las instrucciones.  No use joyas, maquillaje, horquillas, broches ni esmalte de uas.  Para joyas soldadas (permanentes): pulseras, tobilleras, fajas, etc., quteselas antes de la ciruga. Si no se las [place], es posible que el personal del hospital tenga que cortarlas el da de la [place].  No use lociones, polvos ni perfumes.  No se afeite el vello corporal del cuello para abajo 48 horas antes de la [place].  No se permiten lentes de contacto, audfonos ni dentaduras postizas durante la [place].  No traiga objetos de valor al hospital. [facility] no se hace responsable de la prdida de pertenencias o objetos de Licensed conveyancer.  Informe a su mdico si nota algn cambio en su estado de salud (resfriado, fiebre, infeccin).  Lleve ropa cmoda (especfica para el tipo de  ciruga) al hospital.  Despus de la [place], puede ayudar a prevenir complicaciones pulmonares haciendo ejercicios de respiracin. Respire profundamente y tosa cada 1 o 2 horas. Su mdico podra recetarle un dispositivo llamado espirmetro incentivador para ayudarle a respirar profundamente. Al toser o Engineering geologist, sostenga firmemente una almohada contra la incisin  con ambas manos. Esto se llama "entablillar". Esto ayuda a Engineer, drilling incisin y tambin a Technical sales engineer las molestias abdominales.   Si va a pasar la noche en el hospital, deje su maleta en el coche. Despus de la ciruga, podran llevarla a su habitacin. En caso de un aumento en el nmero de [place], podra ser necesario que usted, el [place], contine su atencin posoperatoria en el departamento de [place].  Si recibe el alta el da de la ciruga, no se le permitir regresar a casa en coche. Necesitar que una persona responsable lo lleve a casa y lo acompae durante 24 horas despus de la [place].  Si utiliza transporte pblico, deber estar acompaado por una persona responsable.  Si tiene [employer], llame al [employer] de Preadmisin al [phone].  Poltica de Visitas a Ciruga:  Los [employer] se sometan a [place] o procedimiento pueden [employer] visitantes.  Los [employer] de 16 aos deben estar acompaados por un adulto que no sea [place].  Restricciones Temporales para Visitas Debido al aumento de casos de gripe, VSR y COVID-19:  Los menores de 12 aos no podrn visitar a los Dispensing optician hospitales de [employer] en la mayora de los [place].  Visitas a pacientes hospitalizados:  El horario de visitas es de 7:00 a. m. a 8:00 p. m. Se permiten hasta cuatro visitas a la vez en la habitacin del [place]. Las visitas pueden rotarse con otras Nature conservation officer.  Una visita mayor de 16 aos puede pasar la noche con el paciente y debe estar en la habitacin antes de las 8:00 p. m.  Preparacin para la ciruga con jabn de gluconato de clorhexidina (CHG)  Jabn de gluconato de clorhexidina (CHG) o Un limpiador antisptico que elimina los grmenes y se adhiere a la piel para continuar eliminndolos incluso despus del lavado.  o Se [place] para ducharse la noche anterior a la [place] y la maana  siguiente.  Antes de la [place], usted puede contribuir significativamente a reducir la cantidad de grmenes en su piel. El jabn CHG (gluconato de clorhexidina) es un limpiador antisptico que elimina los grmenes y se adhiere a la piel para continuar eliminndolos incluso despus del lavado.  No lo use si tiene alergia al CHG o a los jabones antibacterianos. Si su piel se enrojece o se irrita, deje de usar el CHG.  1. Dchese la noche anterior a la ciruga y la maana siguiente con jabn CHG.  2. Si decide lavarse el cabello, lvelo primero como de costumbre con su champ habitual.  3. Despus del champ, enjuague bien el cabello y el cuerpo para eliminar el champ.  4. Use el CHG como cualquier otro jabn lquido. Puede aplicar el CHG directamente sobre la piel y lavarse suavemente con una toallita o pao limpio.  5. Aplique el jabn CHG en el cuerpo solo del cuello para abajo. No lo use sobre heridas o Advertising copywriter. Evite el contacto con los ojos, odos, boca y genitales (partes ntimas). Lvese la cara y los genitales (partes ntimas) con su jabn habitual.  6. Lvese bien, prestando especial atencin a la zona donde  se realizar la [place].  7. Enjuague bien el cuerpo con agua tibia.  8. No se duche ni se lave con su jabn habitual despus de usar y enjuagar el jabn CHG.  9. Squese con palmaditas suaves con una toalla limpia.  10. Use pijama limpio para dormir la noche anterior a la ciruga.  12. Coloque sbanas limpias en su cama la noche de su primera ducha y no duerma con mascotas.  13. Dchese de nuevo con jabn CHG el da de la ciruga antes de llegar al hospital.  14. No se aplique desodorantes, lociones ni polvos.  15. Lleve ropa limpia al hospital.   Your procedure is scheduled on: 10/31/23 - Wednesday Report to the Registration Desk on the 1st floor of the [facility]. To find out your arrival time, please call [phone] between 1PM - 3PM on: 10/30/23 -  Tuesday If your arrival time is 6:00 am, do not arrive before that time as the [facility] entrance doors do not open until 6:00 am.  REMEMBER: Instructions that are not followed completely may result in serious medical risk, up to and including death; or upon the discretion of your surgeon and anesthesiologist your surgery may need to be rescheduled.  Do not eat food after midnight the night before surgery.  No gum chewing or hard candies.  You may drink water up to 2 hours before you are scheduled to arrive for your surgery. Do not drink anything within 2 hours of your scheduled arrival time.  Clear liquids include: - water   In addition, your doctor has ordered for you to drink the provided:  Gatorade G2 Drinking this carbohydrate drink up to two hours before surgery helps to reduce insulin resistance and improve patient outcomes. Please complete drinking 2 hours before scheduled arrival time.  One week prior to surgery: Stop Anti-inflammatories (NSAIDS) such as Advil, Aleve, Ibuprofen, Motrin, Naproxen, Naprosyn and Aspirin based products such as Excedrin, Goody's Powder, BC Powder.You may take Tylenol if needed for pain up until the day of surgery.  Stop ANY OVER THE COUNTER supplements until after surgery : VITAMIN D3 , Multivitamin  HOLD metFORMIN (GLUCOPHAGE-XR) beginning 10/29/23.  ON THE DAY OF SURGERY ONLY TAKE THESE MEDICATIONS WITH SIPS OF WATER:  ADVAIR DISKUS  levothyroxine (SYNTHROID)  omeprazole (PRILOSEC)   Use inhalers on the day of surgery and bring to the hospital.   No Alcohol for 24 hours before or after surgery.  No Smoking including e-cigarettes for 24 hours before surgery.  No chewable tobacco products for at least 6 hours before surgery.  No nicotine patches on the day of surgery.  Do not use any "recreational" drugs for at least a week (preferably 2 weeks) before your surgery.  Please be advised that the combination of cocaine and anesthesia may  have negative outcomes, up to and including death. If you test positive for cocaine, your surgery will be cancelled.  On the morning of surgery brush your teeth with toothpaste and water, you may rinse your mouth with mouthwash if you wish. Do not swallow any toothpaste or mouthwash.  Use CHG Soap or wipes as directed on instruction sheet.  Do not wear jewelry, make-up, hairpins, clips or nail polish.  For welded (permanent) jewelry: bracelets, anklets, waist bands, etc.  Please have this removed prior to surgery.  If it is not removed, there is a chance that hospital personnel will need to cut it off on the day of surgery.  Do not wear lotions,  powders, or perfumes.   Do not shave body hair from the neck down 48 hours before surgery.  Contact lenses, hearing aids and dentures may not be worn into surgery.  Do not bring valuables to the hospital. [facility] is not responsible for any missing/lost belongings or valuables.   Notify your doctor if there is any change in your medical condition (cold, fever, infection).  Wear comfortable clothing (specific to your surgery type) to the hospital.  After surgery, you can help prevent lung complications by doing breathing exercises.  Take deep breaths and cough every 1-2 hours. Your doctor may order a device called an Incentive Spirometer to help you take deep breaths. When coughing or sneezing, hold a pillow firmly against your incision with both hands. This is called "splinting." Doing this helps protect your incision. It also decreases belly discomfort.  If you are being admitted to the hospital overnight, leave your suitcase in the car. After surgery it may be brought to your room.  In case of increased patient census, it may be necessary for you, the patient, to continue your postoperative care in the Same Day Surgery department.  If you are being discharged the day of surgery, you will not be allowed to drive home. You will need a  responsible individual to drive you home and stay with you for 24 hours after surgery.   If you are taking public transportation, you will need to have a responsible individual with you.  Please call the Pre-admissions Testing Dept. at [phone] if you have any questions about these instructions.  Surgery Visitation Policy:  Patients having surgery or a procedure may have two visitors.  Children under the age of 47 must have an adult with them who is not the patient.  Temporary Visitor Restrictions Due to increasing cases of flu, RSV and COVID-19: Children ages 26 and under will not be able to visit patients in [facility] hospitals under most circumstances.  Inpatient Visitation:    Visiting hours are 7 a.m. to 8 p.m. Up to four visitors are allowed at one time in a patient room. The visitors may rotate out with other people during the day.  One visitor age 5 or older may stay with the patient overnight and must be in the room by 8 p.m.     Preparing for Surgery with CHLORHEXIDINE GLUCONATE (CHG) Soap  Chlorhexidine Gluconate (CHG) Soap  o An antiseptic cleaner that kills germs and bonds with the skin to continue killing germs even after washing  o Used for showering the night before surgery and morning of surgery  Before surgery, you can play an important role by reducing the number of germs on your skin.  CHG (Chlorhexidine gluconate) soap is an antiseptic cleanser which kills germs and bonds with the skin to continue killing germs even after washing.  Please do not use if you have an allergy to CHG or antibacterial soaps. If your skin becomes reddened/irritated stop using the CHG.  1. Shower the NIGHT BEFORE SURGERY and the MORNING OF SURGERY with CHG soap.  2. If you choose to wash your hair, wash your hair first as usual with your normal shampoo.  3. After shampooing, rinse your hair and body thoroughly to remove the shampoo.  4. Use CHG as you would any other  liquid soap. You can apply CHG directly to the skin and wash gently with a scrungie or a clean washcloth.  5. Apply the CHG soap to your body only  from the neck down. Do not use on open wounds or open sores. Avoid contact with your eyes, ears, mouth, and genitals (private parts). Wash face and genitals (private parts) with your normal soap.  6. Wash thoroughly, paying special attention to the area where your surgery will be performed.  7. Thoroughly rinse your body with warm water.  8. Do not shower/wash with your normal soap after using and rinsing off the CHG soap.  9. Pat yourself dry with a clean towel.  10. Wear clean pajamas to bed the night before surgery.  12. Place clean sheets on your bed the night of your first shower and do not sleep with pets.  13. Shower again with the CHG soap on the day of surgery prior to arriving at the hospital.  14. Do not apply any deodorants/lotions/powders.  15. Please wear clean clothes to the hospital.

## 2023-10-31 ENCOUNTER — Other Ambulatory Visit: Payer: Self-pay

## 2023-10-31 ENCOUNTER — Encounter
Admission: RE | Disposition: A | Discharge status: Home / Self Care | Payer: Self-pay | Source: Ambulatory Visit | Attending: Surgery

## 2023-10-31 ENCOUNTER — Ambulatory Visit: Admitting: Certified Registered"

## 2023-10-31 ENCOUNTER — Ambulatory Visit
Admission: RE | Admit: 2023-10-31 | Discharge: 2023-10-31 | Disposition: A | Discharge status: Home / Self Care | Source: Ambulatory Visit | Attending: Surgery | Admitting: Surgery

## 2023-10-31 ENCOUNTER — Encounter: Payer: Self-pay | Admitting: Surgery

## 2023-10-31 ENCOUNTER — Ambulatory Visit

## 2023-10-31 DIAGNOSIS — S46012D Strain of muscle(s) and tendon(s) of the rotator cuff of left shoulder, subsequent encounter: Secondary | ICD-10-CM | POA: Diagnosis present

## 2023-10-31 DIAGNOSIS — K2100 Gastro-esophageal reflux disease with esophagitis, without bleeding: Secondary | ICD-10-CM | POA: Insufficient documentation

## 2023-10-31 DIAGNOSIS — Y92009 Unspecified place in unspecified non-institutional (private) residence as the place of occurrence of the external cause: Secondary | ICD-10-CM | POA: Insufficient documentation

## 2023-10-31 DIAGNOSIS — D869 Sarcoidosis, unspecified: Secondary | ICD-10-CM | POA: Insufficient documentation

## 2023-10-31 DIAGNOSIS — M7542 Impingement syndrome of left shoulder: Secondary | ICD-10-CM | POA: Insufficient documentation

## 2023-10-31 DIAGNOSIS — E119 Type 2 diabetes mellitus without complications: Secondary | ICD-10-CM | POA: Diagnosis not present

## 2023-10-31 DIAGNOSIS — S46012A Strain of muscle(s) and tendon(s) of the rotator cuff of left shoulder, initial encounter: Secondary | ICD-10-CM | POA: Insufficient documentation

## 2023-10-31 DIAGNOSIS — M7522 Bicipital tendinitis, left shoulder: Secondary | ICD-10-CM | POA: Diagnosis not present

## 2023-10-31 DIAGNOSIS — M7582 Other shoulder lesions, left shoulder: Secondary | ICD-10-CM | POA: Diagnosis present

## 2023-10-31 DIAGNOSIS — Z7984 Long term (current) use of oral hypoglycemic drugs: Secondary | ICD-10-CM | POA: Diagnosis not present

## 2023-10-31 DIAGNOSIS — W109XXA Fall (on) (from) unspecified stairs and steps, initial encounter: Secondary | ICD-10-CM | POA: Diagnosis not present

## 2023-10-31 HISTORY — PX: POSTERIOR LUMBAR FUSION 2 WITH HARDWARE REMOVAL: SHX7297

## 2023-10-31 LAB — GLUCOSE, CAPILLARY
Glucose-Capillary: 122 mg/dL — ABNORMAL HIGH (ref 70–99)
Glucose-Capillary: 147 mg/dL — ABNORMAL HIGH (ref 70–99)

## 2023-10-31 SURGERY — ARTHROSCOPY, SHOULDER WITH DEBRIDEMENT
Anesthesia: General | Site: Shoulder | Laterality: Left

## 2023-10-31 MED ORDER — SUGAMMADEX SODIUM 200 MG/2ML IV SOLN
INTRAVENOUS | Status: DC | PRN
  Administered 2023-10-31: 200 mg via INTRAVENOUS

## 2023-10-31 MED ORDER — ACETAMINOPHEN 325 MG PO TABS: 325.0000 mg | ORAL_TABLET | Freq: Four times a day (QID) | ORAL | Status: DC | PRN

## 2023-10-31 MED ORDER — ONDANSETRON HCL 4 MG/2ML IJ SOLN: 4.0000 mg | Freq: Four times a day (QID) | INTRAMUSCULAR | Status: DC | PRN

## 2023-10-31 MED ORDER — BUPIVACAINE HCL (PF) 0.5 % IJ SOLN
INTRAMUSCULAR | Status: DC | PRN
Start: 2023-10-31 — End: 2023-10-31
  Administered 2023-10-31: 10 mL via PERINEURAL

## 2023-10-31 MED ORDER — DIPHENHYDRAMINE HCL 50 MG/ML IJ SOLN
INTRAMUSCULAR | Status: DC | PRN
  Administered 2023-10-31: 12.5 mg via INTRAVENOUS

## 2023-10-31 MED ORDER — FENTANYL CITRATE PF 50 MCG/ML IJ SOSY
50.0000 ug | PREFILLED_SYRINGE | Freq: Once | INTRAMUSCULAR | Status: AC
  Administered 2023-10-31: 50 ug via INTRAVENOUS

## 2023-10-31 MED ORDER — LIDOCAINE HCL (PF) 1 % IJ SOLN
INTRAMUSCULAR | Status: DC | PRN
Start: 2023-10-31 — End: 2023-10-31
  Administered 2023-10-31: 2.5 mL via SUBCUTANEOUS

## 2023-10-31 MED ORDER — MIDAZOLAM HCL 2 MG/2ML IJ SOLN
INTRAMUSCULAR | Status: AC
  Filled 2023-10-31: qty 2

## 2023-10-31 MED ORDER — METOCLOPRAMIDE HCL 5 MG/ML IJ SOLN: 5.0000 mg | Freq: Three times a day (TID) | INTRAMUSCULAR | Status: DC | PRN

## 2023-10-31 MED ORDER — ONDANSETRON HCL 4 MG PO TABS
4.0000 mg | ORAL_TABLET | Freq: Four times a day (QID) | ORAL | Status: DC | PRN
Start: 2023-10-31 — End: 2023-10-31

## 2023-10-31 MED ORDER — SODIUM CHLORIDE 0.9 % IV SOLN
INTRAVENOUS | Status: DC | PRN
Start: 2023-10-31 — End: 2023-10-31

## 2023-10-31 MED ORDER — OXYCODONE HCL 5 MG PO TABS: 5.0000 mg | ORAL_TABLET | ORAL | Status: DC | PRN

## 2023-10-31 MED ORDER — BUPIVACAINE LIPOSOME 1.3 % IJ SUSP
INTRAMUSCULAR | Status: AC
  Filled 2023-10-31: qty 20

## 2023-10-31 MED ORDER — CEFAZOLIN SODIUM-DEXTROSE 2-4 GM/100ML-% IV SOLN
2.0000 g | INTRAVENOUS | Status: AC
Start: 2023-10-31 — End: 2023-10-31
  Administered 2023-10-31: 2 g via INTRAVENOUS

## 2023-10-31 MED ORDER — PROPOFOL 10 MG/ML IV BOLUS
INTRAVENOUS | Status: AC
  Filled 2023-10-31: qty 20

## 2023-10-31 MED ORDER — SODIUM CHLORIDE 0.9 % IV SOLN: INTRAVENOUS | Status: DC

## 2023-10-31 MED ORDER — CEFAZOLIN SODIUM-DEXTROSE 2-4 GM/100ML-% IV SOLN
INTRAVENOUS | Status: AC
  Filled 2023-10-31: qty 100

## 2023-10-31 MED ORDER — OXYCODONE HCL 5 MG PO TABS: 5.0000 mg | ORAL_TABLET | ORAL | 0 refills | Status: AC | PRN

## 2023-10-31 MED ORDER — CHLORHEXIDINE GLUCONATE 0.12 % MT SOLN
OROMUCOSAL | Status: AC
  Filled 2023-10-31: qty 15

## 2023-10-31 MED ORDER — FENTANYL CITRATE (PF) 100 MCG/2ML IJ SOLN: 25.0000 ug | INTRAMUSCULAR | Status: DC | PRN

## 2023-10-31 MED ORDER — ORAL CARE MOUTH RINSE: 15.0000 mL | Freq: Once | OROMUCOSAL | Status: AC

## 2023-10-31 MED ORDER — KETOROLAC TROMETHAMINE 15 MG/ML IJ SOLN: 15.0000 mg | Freq: Once | INTRAMUSCULAR | Status: DC

## 2023-10-31 MED ORDER — LIDOCAINE HCL (PF) 1 % IJ SOLN
INTRAMUSCULAR | Status: AC
  Filled 2023-10-31: qty 5

## 2023-10-31 MED ORDER — EPINEPHRINE PF 1 MG/ML IJ SOLN
INTRAMUSCULAR | Status: AC
  Filled 2023-10-31: qty 1

## 2023-10-31 MED ORDER — BUPIVACAINE-EPINEPHRINE (PF) 0.5% -1:200000 IJ SOLN
INTRAMUSCULAR | Status: AC
  Filled 2023-10-31: qty 30

## 2023-10-31 MED ORDER — BUPIVACAINE-EPINEPHRINE 0.5% -1:200000 IJ SOLN
INTRAMUSCULAR | Status: DC | PRN
  Administered 2023-10-31: 30 mL

## 2023-10-31 MED ORDER — DROPERIDOL 2.5 MG/ML IJ SOLN: 0.6250 mg | Freq: Once | INTRAMUSCULAR | Status: DC | PRN

## 2023-10-31 MED ORDER — LIDOCAINE HCL (CARDIAC) PF 100 MG/5ML IV SOSY
PREFILLED_SYRINGE | INTRAVENOUS | Status: DC | PRN
  Administered 2023-10-31: 80 mg via INTRAVENOUS

## 2023-10-31 MED ORDER — FENTANYL CITRATE PF 50 MCG/ML IJ SOSY
PREFILLED_SYRINGE | INTRAMUSCULAR | Status: AC
  Filled 2023-10-31: qty 1

## 2023-10-31 MED ORDER — DEXAMETHASONE SODIUM PHOSPHATE 10 MG/ML IJ SOLN
INTRAMUSCULAR | Status: DC | PRN
  Administered 2023-10-31: 10 mg via INTRAVENOUS

## 2023-10-31 MED ORDER — FENTANYL CITRATE (PF) 100 MCG/2ML IJ SOLN
INTRAMUSCULAR | Status: AC
  Filled 2023-10-31: qty 2

## 2023-10-31 MED ORDER — ACETAMINOPHEN 10 MG/ML IV SOLN
INTRAVENOUS | Status: DC | PRN
  Administered 2023-10-31: 1000 mg via INTRAVENOUS

## 2023-10-31 MED ORDER — FENTANYL CITRATE (PF) 100 MCG/2ML IJ SOLN
INTRAMUSCULAR | Status: DC | PRN
  Administered 2023-10-31 (×2): 50 ug via INTRAVENOUS

## 2023-10-31 MED ORDER — ACETAMINOPHEN 10 MG/ML IV SOLN
INTRAVENOUS | Status: AC
  Filled 2023-10-31: qty 100

## 2023-10-31 MED ORDER — PROPOFOL 10 MG/ML IV BOLUS
INTRAVENOUS | Status: DC | PRN
  Administered 2023-10-31: 150 mg via INTRAVENOUS

## 2023-10-31 MED ORDER — BUPIVACAINE HCL (PF) 0.5 % IJ SOLN
INTRAMUSCULAR | Status: AC
  Filled 2023-10-31: qty 10

## 2023-10-31 MED ORDER — SODIUM CHLORIDE 0.9% FLUSH: 3.0000 mL | Freq: Two times a day (BID) | INTRAVENOUS | Status: DC

## 2023-10-31 MED ORDER — LACTATED RINGERS IV SOLN
INTRAVENOUS | Status: DC | PRN
  Administered 2023-10-31: 3001 mL

## 2023-10-31 MED ORDER — MIDAZOLAM HCL 2 MG/2ML IJ SOLN
1.0000 mg | INTRAMUSCULAR | Status: DC | PRN
  Administered 2023-10-31: 1 mg via INTRAVENOUS

## 2023-10-31 MED ORDER — PHENYLEPHRINE 80 MCG/ML (10ML) SYRINGE FOR IV PUSH (FOR BLOOD PRESSURE SUPPORT)
PREFILLED_SYRINGE | INTRAVENOUS | Status: DC | PRN
  Administered 2023-10-31: 160 ug via INTRAVENOUS
  Administered 2023-10-31 (×2): 80 ug via INTRAVENOUS

## 2023-10-31 MED ORDER — ROCURONIUM BROMIDE 100 MG/10ML IV SOLN
INTRAVENOUS | Status: DC | PRN
  Administered 2023-10-31: 50 mg via INTRAVENOUS

## 2023-10-31 MED ORDER — ONDANSETRON HCL 4 MG/2ML IJ SOLN
INTRAMUSCULAR | Status: DC | PRN
  Administered 2023-10-31: 4 mg via INTRAVENOUS

## 2023-10-31 MED ORDER — PHENYLEPHRINE HCL-NACL 20-0.9 MG/250ML-% IV SOLN
INTRAVENOUS | Status: DC | PRN
  Administered 2023-10-31: 20 ug/min via INTRAVENOUS

## 2023-10-31 MED ORDER — METOCLOPRAMIDE HCL 10 MG PO TABS: 5.0000 mg | ORAL_TABLET | Freq: Three times a day (TID) | ORAL | Status: DC | PRN

## 2023-10-31 MED ORDER — SODIUM CHLORIDE 0.9% FLUSH: 3.0000 mL | INTRAVENOUS | Status: DC | PRN

## 2023-10-31 MED ORDER — BUPIVACAINE LIPOSOME 1.3 % IJ SUSP
INTRAMUSCULAR | Status: DC | PRN
  Administered 2023-10-31: 20 mL via PERINEURAL

## 2023-10-31 MED ORDER — MIDAZOLAM HCL 5 MG/5ML IJ SOLN
INTRAMUSCULAR | Status: DC | PRN
  Administered 2023-10-31: 1 mg via INTRAVENOUS

## 2023-10-31 MED ORDER — CHLORHEXIDINE GLUCONATE 0.12 % MT SOLN
15.0000 mL | Freq: Once | OROMUCOSAL | Status: AC
  Administered 2023-10-31: 15 mL via OROMUCOSAL

## 2023-10-31 SURGICAL SUPPLY — 49 items
ANCHOR BONE REGENETEN (Anchor) IMPLANT
ANCHOR HEALICOIL REGEN 5.5 (Anchor) IMPLANT
ANCHOR JUGGERKNOT WTAP NDL 2.9 (Anchor) IMPLANT
ANCHOR QFIX 2.8 SUT MINI TAPE (Anchor) IMPLANT
ANCHOR SUT W/ ORTHOCORD (Anchor) IMPLANT
ANCHOR TENDON REGENETEN (Staple) IMPLANT
BIT DRILL JUGRKNT W/NDL BIT2.9 (DRILL) IMPLANT
BLADE FULL RADIUS 3.5 (BLADE) ×1 IMPLANT
BUR ACROMIONIZER 4.0 (BURR) ×1 IMPLANT
CHLORAPREP W/TINT 26 (MISCELLANEOUS) ×1 IMPLANT
COVER MAYO STAND STRL (DRAPES) ×1 IMPLANT
DILATOR 5.5 THREADED HEALICOIL (MISCELLANEOUS) IMPLANT
DRILL JUGGERKNOT W/NDL BIT 2.9 (DRILL) ×1 IMPLANT
ELECT CAUTERY BLADE 6.4 (BLADE) ×1 IMPLANT
ELECT REM PT RETURN 9FT ADLT (ELECTROSURGICAL) ×1 IMPLANT
ELECTRODE REM PT RTRN 9FT ADLT (ELECTROSURGICAL) ×1 IMPLANT
GAUZE SPONGE 4X4 12PLY STRL (GAUZE/BANDAGES/DRESSINGS) ×1 IMPLANT
GAUZE XEROFORM 1X8 LF (GAUZE/BANDAGES/DRESSINGS) ×1 IMPLANT
GLOVE BIO SURGEON STRL SZ7.5 (GLOVE) ×2 IMPLANT
GLOVE BIO SURGEON STRL SZ8 (GLOVE) ×2 IMPLANT
GLOVE BIOGEL PI IND STRL 8 (GLOVE) ×1 IMPLANT
GLOVE INDICATOR 8.0 STRL GRN (GLOVE) ×1 IMPLANT
GOWN STRL REUS W/ TWL LRG LVL3 (GOWN DISPOSABLE) ×1 IMPLANT
GOWN STRL REUS W/ TWL XL LVL3 (GOWN DISPOSABLE) ×1 IMPLANT
GRASPER SUT 15 45D LOW PRO (SUTURE) IMPLANT
IMPL REGENETEN LRG (Shoulder) IMPLANT
IMPLANT REGENETEN LRG (Shoulder) ×1 IMPLANT
IV LR IRRIG 3000ML ARTHROMATIC (IV SOLUTION) ×2 IMPLANT
KIT CANNULA 8X76-LX IN CANNULA (CANNULA) ×1 IMPLANT
KIT SUTURE 2.8 Q-FIX DISP (MISCELLANEOUS) IMPLANT
MANIFOLD NEPTUNE II (INSTRUMENTS) ×1 IMPLANT
MASK FACE SPIDER DISP (MASK) ×1 IMPLANT
MAT ABSORB FLUID 56X50 GRAY (MISCELLANEOUS) ×1 IMPLANT
PACK ARTHROSCOPY SHOULDER (MISCELLANEOUS) ×1 IMPLANT
PAD ABD DERMACEA PRESS 5X9 (GAUZE/BANDAGES/DRESSINGS) ×2 IMPLANT
PASSER SUT FIRSTPASS SELF (INSTRUMENTS) IMPLANT
SLING ULTRA II LG (MISCELLANEOUS) ×1 IMPLANT
SPONGE T-LAP 18X18 ~~LOC~~+RFID (SPONGE) ×1 IMPLANT
STAPLER SKIN PROX 35W (STAPLE) ×1 IMPLANT
STRAP SAFETY 5IN WIDE (MISCELLANEOUS) ×1 IMPLANT
SUT ETHIBOND 0 MO6 C/R (SUTURE) ×1 IMPLANT
SUT ULTRABRAID 2 COBRAID 38 (SUTURE) IMPLANT
SUT VIC AB 2-0 CT1 TAPERPNT 27 (SUTURE) ×2 IMPLANT
TAPE MICROFOAM 4IN (TAPE) ×1 IMPLANT
TRAP FLUID SMOKE EVACUATOR (MISCELLANEOUS) ×1 IMPLANT
TUBE SET DOUBLEFLO INFLOW (TUBING) ×1 IMPLANT
TUBING CONNECTING 10 (TUBING) ×1 IMPLANT
WAND WEREWOLF FLOW 90D (MISCELLANEOUS) ×1 IMPLANT
WATER STERILE IRR 500ML POUR (IV SOLUTION) ×1 IMPLANT

## 2023-10-31 NOTE — Anesthesia Procedure Notes (Signed)
 Anesthesia Regional Block: Interscalene brachial plexus block   Pre-Anesthetic Checklist: , timeout performed,  Correct Patient, Correct Site, Correct Laterality,  Correct Procedure, Correct Position, site marked,  Risks and benefits discussed,  Surgical consent,  Pre-op evaluation,  At surgeon's request and post-op pain management  Laterality: Left and Upper  Prep: chloraprep       Needles:  Injection technique: Single-shot  Needle Type: Stimiplex     Needle Length: 5cm  Needle Gauge: 22     Additional Needles:   Procedures:,,,, ultrasound used (permanent image in chart),,    Narrative:  Start time: 10/31/2023 10:32 AM End time: 10/31/2023 10:34 AM Injection made incrementally with aspirations every 5 mL.  Performed by: Personally  Anesthesiologist: Lenard Simmer, MD  Additional Notes: Functioning IV was confirmed and monitors were applied.  A 50mm 22ga Stimuplex needle was used. Sterile prep and drape,hand hygiene and sterile gloves were used.  Negative aspiration and negative test dose prior to incremental administration of local anesthetic. The patient tolerated the procedure well.

## 2023-10-31 NOTE — Discharge Instructions (Addendum)
 Orthopedic discharge instructions: Keep dressing dry and intact.  May shower after dressing changed on post-op day #4 (Sunday).  Cover staples with Band-Aids after drying off, then reapply shoulder immobilizer. Apply ice frequently to shoulder. Take oxycodone as prescribed when needed.  May supplement with ES Tylenol if necessary. Keep shoulder immobilizer on at all times except may remove for bathing purposes. Follow-up in 10-14 days or as scheduled.  Information for Discharge Teaching:  DO NOT REMOVE TEAL EXPAREL BRACELET FOR 4 DAYS, (96 hours), 11/04/2023 EXPAREL (bupivacaine liposome injectable suspension)   Pain relief is important to your recovery. The goal is to control your pain so you can move easier and return to your normal activities as soon as possible after your procedure. Your physician may use several types of medicines to manage pain, swelling, and more.  Your surgeon or anesthesiologist gave you EXPAREL(bupivacaine) to help control your pain after surgery.  EXPAREL is a local anesthetic designed to release slowly over an extended period of time to provide pain relief by numbing the tissue around the surgical site. EXPAREL is designed to release pain medication over time and can control pain for up to 72 hours. Depending on how you respond to EXPAREL, you may require less pain medication during your recovery. EXPAREL can help reduce or eliminate the need for opioids during the first few days after surgery when pain relief is needed the most. EXPAREL is not an opioid and is not addictive. It does not cause sleepiness or sedation.   Important! A teal colored band has been placed on your arm with the date, time and amount of EXPAREL you have received. Please leave this armband in place for the full 96 hours following administration, and then you may remove the band. If you return to the hospital for any reason within 96 hours following the administration of EXPAREL, the armband  provides important information that your health care providers to know, and alerts them that you have received this anesthetic.    Possible side effects of EXPAREL: Temporary loss of sensation or ability to move in the area where medication was injected. Nausea, vomiting, constipation Rarely, numbness and tingling in your mouth or lips, lightheadedness, or anxiety may occur. Call your doctor right away if you think you may be experiencing any of these sensations, or if you have other questions regarding possible side effects.  Follow all other discharge instructions given to you by your surgeon or nurse. Eat a healthy diet and drink plenty of water or other fluids.

## 2023-10-31 NOTE — H&P (Signed)
 History of Present Illness:  Andrew Elliott is a 63 y.o. male who presents for a history and physical for an upcoming left shoulder arthroscopy with decompression, debridement, rotator cuff repair, and biceps tenodesis to be done by Dr. Joice Lofts on October 31, 2023. The patient saw Dr. Joice Lofts for follow-up of his left shoulder and elbow pain following an injury in which he slipped and fell on his staircase at home landing on his outstretched left arm 10 weeks ago. The patient was seen 8 weeks ago. He continues to note moderate to severe pain in his shoulder which he rates at 8/10 on today's visit. He has been taking Tylenol and a hydrocodone based liquid syrup as necessary with limited benefit. His pain is worse with activities at or above shoulder level as well as when trying to reach behind his back. He also has pain at night. He feels that his arm is quite "weak" and he has difficulty performing 10 6 at work which requires him to raise his arm above shoulder level. He denies any reinjury since his last visit, and denies any numbness or paresthesias down his arm to his hand. He presents today to review the results of his recent left shoulder MRI scan.  Current Outpatient Medications:  acetaminophen (TYLENOL) 500 MG tablet Take 500 mg by mouth every 6 (six) hours as needed  albuterol (PROVENTIL) 2.5 mg /3 mL (0.083 %) nebulizer solution Take 3 mLs (2.5 mg total) by nebulization every 6 (six) hours as needed 1080 mL 0  albuterol 90 mcg/actuation inhaler TAKE 2 PUFFS BY MOUTH EVERY 4 TO 6 HOURS AS NEEDED  atorvastatin (LIPITOR) 10 MG tablet Take 10 mg by mouth once daily  fluticasone propion-salmeteroL (ADVAIR DISKUS) 250-50 mcg/dose diskus inhaler Inhale 1 Puff into the lungs every 12 (twelve) hours 3 each 3  levothyroxine (SYNTHROID) 75 MCG tablet Take 1 tablet (75 mcg total) by mouth once daily Take on an empty stomach with a glass of water at least 30-60 minutes before breakfast. 30 tablet 0  metFORMIN  (GLUCOPHAGE-XR) 500 MG XR tablet 500 mg daily with dinner  montelukast (SINGULAIR) 10 mg tablet Take 10 mg by mouth every evening  omeprazole (PRILOSEC) 40 MG DR capsule Take 40 mg by mouth once daily  predniSONE (DELTASONE) 10 MG tablet Prednisone 10 mg, 4 pills q day x 2 days, 3 pills q day x 2 days, 2 pills q day x 2 days, 1 pill q day x 2 days. 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 Joice Lofts)  COLONOSCOPY 01/09/2020 (Hyperplastic colon polyp/PHx CP/Repeat 63yrs/[facility])   Family History:  No Known Problems Mother  Anemia Father   Social History:   Socioeconomic History:  Marital status: Married  Spouse  name: Rosa  Number of children: 5  Years of education: 12  Tobacco Use  Smoking status: Never  Smokeless tobacco: Never  Vaping Use  Vaping status: Never Used  Substance and Sexual Activity  Alcohol use: Not Currently  Drug use: No  Sexual activity: Defer   Social Drivers of Health:   Physicist, medical Strain: Low Risk (08/27/2023)  Overall Financial Resource Strain (CARDIA)  Difficulty of Paying Living Expenses: Not hard at all  Food  Insecurity: No Food Insecurity (08/27/2023)  Hunger Vital Sign  Worried About Running Out of Food in the Last Year: Never true  Ran Out of Food in the Last Year: Never true  Transportation Needs: No Transportation Needs (08/27/2023)  PRAPARE - Risk analyst (Medical): No  Lack of Transportation (Non-Medical): No   Review of Systems:  A comprehensive 14 point ROS was performed, reviewed, and the pertinent orthopaedic findings are documented in the HPI.  Physical Exam: Vitals:  10/26/23 1441  Weight: 92.5 kg (204 lb)  Height: 170.2 cm (5\' 7" )  PainSc: 10-Worst pain ever  PainLoc: Shoulder   General/Constitutional: The patient appears to be well-nourished, well-developed, and in no acute distress. Neuro/Psych: Normal mood and affect, oriented to person, place and time. Eyes: Non-icteric. Pupils are equal, round, and reactive to light, and exhibit synchronous movement. ENT: Unremarkable. Lymphatic: No palpable adenopathy. Respiratory: Lungs clear to auscultation, Normal chest excursion, No wheezes, and Non-labored breathing Cardiovascular: Regular rate and rhythm. No murmurs. and No edema, swelling or tenderness, except as noted in detailed exam. Integumentary: No impressive skin lesions present, except as noted in detailed exam. Musculoskeletal: Unremarkable, except as noted in detailed exam.  Heart: Examination of the heart reveals regular, rate, and rhythm. There is no murmur noted on ascultation. There is a normal apical pulse.  Lungs: Lungs are clear to auscultation. There is no wheeze, rhonchi, or crackles. There is normal expansion of bilateral chest walls.   Left shoulder exam: Skin inspection of the left shoulder again is unremarkable. No swelling, erythema, ecchymosis, abrasions, or other skin abnormalities are identified. There is moderate tenderness to palpation over the superior and posterior superior aspects of the shoulder, and mild tenderness to  palpation anteriorly along the bicipital groove. Actively, he is able to forward flex to near 90 degrees and abduct to 80 degrees with moderate pain at the extremes of both flexion and abduction. He also is able to internally rotate to L3, so long as he moves slowly. He exhibits 3+/5 strength with resisted forward flexion and abduction, and 4-4+/5 strength with resisted internal and external rotation. He has moderate pain with resisted forward flexion and abduction, and mild pain with resisted internal and external rotation. He has an equivocally positive Speed's test, as well as a positive impingement sign.   X-rays/MRI/Lab data:  A recent MRI scan of the left shoulder is available for review and has been reviewed by myself. This study demonstrates evidence of a full-thickness tear involving the supraspinatus tendon with retraction back to the glenohumeral joint. There is mild supraspinatus atrophy. The remaining rotator cuff tendons demonstrate moderate tendinopathic changes without partial or full-thickness tearing. The biceps tendon demonstrates moderate tendinopathic changes as well. The glenohumeral joint appears to be well-maintained. No lytic lesions or fractures are identified. Both the films and report were reviewed by myself and discussed with the patient and his wife.  Assessment: 1. Traumatic complete tear of left rotator cuff.  2. Rotator cuff tendinitis, left.  3. Impingement syndrome  of left shoulder.   Plan: The treatment options were discussed with the patient and his wife. In addition, patient educational materials were provided regarding the diagnosis and treatment options. The patient notes that his left elbow symptoms have resolved and he does not feel that he needs any further treatment for his left elbow. However, he is quite frustrated by his persistent symptoms and function limitations as a pertains to his left shoulder, and is ready to consider more aggressive treatment  options. Therefore, I have recommended a surgical procedure, specifically a left shoulder arthroscopy with debridement, decompression, rotator cuff repair, and biceps tenodesis. The procedure was discussed with the patient, as were the potential risks (including bleeding, infection, nerve and/or blood vessel injury, persistent or recurrent pain, failure of the repair, progression of arthritis, need for further surgery, blood clots, strokes, heart attacks and/or arhythmias, pneumonia, etc.) and benefits. The patient states his understanding and wishes to proceed. All of the patient's questions and concerns were answered. He can call any time with further concerns. He will follow up post-surgery, routine.   This note will serve as the history and physical for surgery scheduled on October 31, 2023 for a left shoulder arthroscopy with debridement, decompression, rotator cuff repair, and biceps tenodesis to be done by Dr. Joice Lofts.    H&P reviewed and patient re-examined. No changes.

## 2023-10-31 NOTE — Anesthesia Preprocedure Evaluation (Signed)
 Anesthesia Evaluation  Patient identified by MRN, date of birth, ID band Patient awake    Reviewed: Allergy & Precautions, H&P , NPO status , Patient's Chart, lab work & pertinent test results, reviewed documented beta blocker date and time   Airway Mallampati: II   Neck ROM: full    Dental  (+) Poor Dentition, Dental Advidsory Given   Pulmonary neg pulmonary ROS, neg shortness of breath, asthma , pneumonia, resolved, neg recent URI   Pulmonary exam normal        Cardiovascular Exercise Tolerance: Good negative cardio ROS Normal cardiovascular exam Rhythm:regular Rate:Normal     Neuro/Psych negative neurological ROS  negative psych ROS   GI/Hepatic Neg liver ROS,GERD  ,,  Endo/Other  diabetes, Well ControlledHypothyroidism    Renal/GU negative Renal ROS  negative genitourinary   Musculoskeletal   Abdominal   Peds  Hematology negative hematology ROS (+)   Anesthesia Other Findings Past Medical History: No date: Asthma 09/2019: COVID-19 No date: Diabetes mellitus without complication (HCC) No date: Dyspnea     Comment:  with exertion No date: GERD (gastroesophageal reflux disease) 2013: Hypothyroid 2021: Pneumonia Past Surgical History: 10/19/2016: COLONOSCOPY WITH PROPOFOL; N/A     Comment:  Procedure: COLONOSCOPY WITH PROPOFOL;  Surgeon: Christena Deem, MD;  Location: [facility] ENDOSCOPY;  Service:               Endoscopy;  Laterality: N/A; 01/23/2018: DISTAL BICEPS TENDON REPAIR; Left     Comment:  Procedure: PRIMARY DISTAL BICEPS TENDON REPAIR TENDON               RUPTURE;  Surgeon: Christena Flake, MD;  Location: [facility];  Service: Orthopedics;  Laterality: Left;                C ARM OR FLUOROSCAN UNIT BIOMET TOGGLE LOK DEVICE 10/19/2016: ESOPHAGOGASTRODUODENOSCOPY (EGD) WITH PROPOFOL; N/A     Comment:  Procedure: ESOPHAGOGASTRODUODENOSCOPY (EGD) WITH                PROPOFOL;  Surgeon: Christena Deem, MD;  Location:               [facility] ENDOSCOPY;  Service: Endoscopy;  Laterality: N/A; BMI    Body Mass Index: 31.32 kg/m     Reproductive/Obstetrics negative OB ROS                              Anesthesia Physical Anesthesia Plan  ASA: 3  Anesthesia Plan: General   Post-op Pain Management: Regional block*   Induction: Intravenous  PONV Risk Score and Plan: 2 and Ondansetron, Dexamethasone, Midazolam and Treatment may vary due to age or medical condition  Airway Management Planned: Oral ETT  Additional Equipment:   Intra-op Plan:   Post-operative Plan: Extubation in OR  Informed Consent: I have reviewed the patients History and Physical, chart, labs and discussed the procedure including the risks, benefits and alternatives for the proposed anesthesia with the patient or authorized representative who has indicated his/her understanding and acceptance.     Dental Advisory Given  Plan Discussed with: CRNA  Anesthesia Plan Comments: (We discussed nerve block, but patient refused at this time.  If he would like to have it post-op we can do it in the recovery room.)  Anesthesia Quick Evaluation

## 2023-10-31 NOTE — Transfer of Care (Signed)
 Immediate Anesthesia Transfer of Care Note  Patient: Andrew Elliott  Procedure(s) Performed: ARTHROSCOPY, SHOULDER WITH DEBRIDEMENT (Left: Shoulder)  Patient Location: PACU  Anesthesia Type:General  Level of Consciousness: awake  Airway & Oxygen Therapy: Patient Spontanous Breathing  Post-op Assessment: Report given to RN and Post -op Vital signs reviewed and stable  Post vital signs: Reviewed and stable  Last Vitals:  Vitals Value Taken Time  BP 118/79 10/31/23 1312  Temp    Pulse 66 10/31/23 1315  Resp 23 10/31/23 1315  SpO2 97 % 10/31/23 1315  Vitals shown include unfiled device data.  Last Pain:  Vitals:   10/31/23 0837  TempSrc: Temporal  PainSc: 5          Complications: No notable events documented.

## 2023-10-31 NOTE — Op Note (Signed)
 10/31/2023  12:48 PM  Patient:   Andrew Elliott  Pre-Op Diagnosis:   Massive traumatic rotator cuff tear left shoulder  Post-Op Diagnosis:   Massive traumatic rotator cuff tear with degenerative labral fraying and biceps tendinopathy, left shoulder.  Procedure:   Extensive arthroscopic debridement, arthroscopic repair of subscapularis tendon tear, arthroscopic subacromial decompression, mini-open repair of massive rotator cuff tear augmented with a Regeneten patch, and mini-open biceps tenodesis, left shoulder.  Anesthesia:   General endotracheal with interscalene block placed preoperatively by the anesthesiologist.  Surgeon:   Maryagnes Amos, MD  Assistant:   Griffin Basil, RNFA  Findings:   As above.  There was moderate labral fraying of the the superior and posterior superior labrum without frank detachment from the glenoid rim.  There was a near full-thickness tear involving the superior insertional fibers of the subscapularis tendon, as well as full-thickness tears of the entire supraspinatus and infraspinatus tendons.  The teres minor was in satisfactory condition.  The articular surfaces of the glenoid and humerus both were in satisfactory condition.  There was moderate "lip sticking" with moderate fraying of the biceps tendon.  Complications:   None  Estimated blood loss:   10 cc  Fluids:   300 cc  Tourniquet time:   None  Drains:   None  Closure:   Staples      Brief clinical note:   The patient is a 63 year old male with a 4 to 5 month history of left shoulder pain and weakness following an incident in which he slipped on his stairs and fell onto his outstretched left hand. The patient's symptoms have progressed despite medications, activity modification, etc. The patient's history and examination are consistent with impingement/tendinopathy with a massive rotator cuff tear. These findings were confirmed by MRI scan. The patient presents at this time for definitive  management of these shoulder symptoms.  Procedure:   The patient underwent placement of an interscalene block by the anesthesiologist in the preoperative holding area before being brought into the operating room and lain in the supine position. The patient then underwent general endotracheal intubation and anesthesia before being repositioned in the beach chair position using the beach chair positioner. The left shoulder and upper extremity were prepped with ChloraPrep solution before being draped sterilely. Preoperative antibiotics were administered. A timeout was performed to confirm the proper surgical site before the expected portal sites and incision site were injected with 0.5% Sensorcaine with epinephrine.   A posterior portal was created and the glenohumeral joint thoroughly inspected with the findings as described above. An anterior portal was created using an outside-in technique. The labrum and rotator cuff were further probed, again confirming the above-noted findings. The areas of labral fraying were debrided back to stable margins, as were the torn margins of the rotator cuff. Areas of synovitis also were debrided back to stable margins, including much of the rotator interval to facilitate repair of the subscapularis tendon. The ArthroCare wand was inserted and used to release the biceps tendon. It also was used to obtain hemostasis as well as to "anneal" the labrum superiorly and anteriorly. The instruments were removed from the joint after suctioning the excess fluid.  The camera was repositioned through the posterior portal into the subacromial space. A separate lateral portal was created using an outside-in technique. The 3.5 mm full-radius resector was introduced and used to perform a subtotal bursectomy. The ArthroCare wand was then inserted and used to remove the periosteal tissue off the undersurface of  the anterior third of the acromion as well as to recess the coracoacromial ligament  from its attachment along the anterior and lateral margins of the acromion. The 4.0 mm acromionizing bur was introduced and used to complete the decompression by removing the undersurface of the anterior third of the acromion. The full radius resector was reintroduced to remove any residual bony debris before the ArthroCare wand was reintroduced to obtain hemostasis. The instruments were then removed from the subacromial space after suctioning the excess fluid.  An approximately 4-5 cm incision was made over the anterolateral aspect of the shoulder beginning at the anterolateral corner of the acromion and extending distally in line with the bicipital groove. This incision was carried down through the subcutaneous tissues to expose the deltoid fascia. The raphae between the anterior and middle thirds was identified and this plane developed to provide access into the subacromial space. Additional bursal tissues were debrided sharply using Metzenbaum scissors. The rotator cuff tear was readily identified.   The bicipital groove was identified by palpation and opened for 1-1.5 cm. The biceps tendon stump was retrieved through this defect. The floor of the bicipital groove was roughened with a curet before a Biomet 2.9 mm JuggerKnot anchor was inserted. Both sets of sutures were passed through the biceps tendon and tied securely to effect the tenodesis. The bicipital sheath was reapproximated using two #0 Ethibond interrupted sutures, incorporating the biceps tendon to further reinforce the tenodesis.  The margins of the rotator cuff tear were debrided sharply with a #15 blade and the exposed greater tuberosity roughened with a rongeur. The tear was repaired using two Smith & Nephew 2.8 mm Q-Fix anchors. These sutures were then brought back laterally and secured using two Smith & Nephew Healicoil knotless RegeneSorb anchors to create a two-layer closure. An apparent watertight closure was obtained.  Given the  size of the tear and the relatively poor quality of the tissue, it was elected to reinforce this repair using a Smith & Nephew regenerative patch. Therefore, a large patch was selected and applied over the repair site. It was secured using the appropriate bone and soft tissue staples. This added an extra measure of complexity to the case, as well as another 20 to 30 minutes of surgical time.  The wound was copiously irrigated with sterile saline solution before the deltoid raphae was reapproximated using 2-0 Vicryl interrupted sutures. The subcutaneous tissues were closed in two layers using 2-0 Vicryl interrupted sutures before the skin was closed using staples. The portal sites also were closed using staples. A sterile bulky dressing was applied to the shoulder before the arm was placed into a shoulder immobilizer. The patient was then awakened, extubated, and returned to the recovery room in satisfactory condition after tolerating the procedure well.

## 2023-10-31 NOTE — Anesthesia Procedure Notes (Signed)
 Procedure Name: Intubation Date/Time: 10/31/2023 10:49 AM  Performed by: Cheral Bay, CRNAPre-anesthesia Checklist: Patient identified, Emergency Drugs available, Suction available and Patient being monitored Patient Re-evaluated:Patient Re-evaluated prior to induction Oxygen Delivery Method: Circle system utilized Preoxygenation: Pre-oxygenation with 100% oxygen Induction Type: IV induction Ventilation: Mask ventilation without difficulty Laryngoscope Size: McGrath and 4 Grade View: Grade I Tube type: Oral Tube size: 7.0 mm Number of attempts: 1 Airway Equipment and Method: Stylet Placement Confirmation: ETT inserted through vocal cords under direct vision, positive ETCO2 and breath sounds checked- equal and bilateral Secured at: 21 cm Tube secured with: Tape Dental Injury: Teeth and Oropharynx as per pre-operative assessment

## 2023-11-02 ENCOUNTER — Encounter: Payer: Self-pay | Admitting: Surgery

## 2023-11-05 ENCOUNTER — Encounter: Payer: Self-pay | Admitting: Surgery

## 2023-11-12 NOTE — Anesthesia Postprocedure Evaluation (Signed)
 Anesthesia Post Note  Patient: [employer]  Procedure(s) Performed: ARTHROSCOPY, SHOULDER WITH DEBRIDEMENT (Left: Shoulder)  Patient location during evaluation: PACU Anesthesia Type: General Level of consciousness: awake and alert Pain management: pain level controlled Vital Signs Assessment: post-procedure vital signs reviewed and stable Respiratory status: spontaneous breathing, nonlabored ventilation, respiratory function stable and patient connected to nasal cannula oxygen Cardiovascular status: blood pressure returned to baseline and stable Postop Assessment: no apparent nausea or vomiting Anesthetic complications: no   No notable events documented.   Last Vitals:  Vitals:   10/31/23 1400 10/31/23 1415  BP: 122/86 122/86  Pulse: 63 61  Resp: 18 18  Temp:  36.8 C  SpO2: 94% 97%    Last Pain:  Vitals:   10/31/23 1415  TempSrc: Temporal  PainSc: 0-No pain                 Lenard Simmer

## 2024-02-08 ENCOUNTER — Ambulatory Visit
Admission: EM | Admit: 2024-02-08 | Discharge: 2024-02-08 | Disposition: A | Discharge status: Home / Self Care | Attending: Emergency Medicine | Admitting: Emergency Medicine

## 2024-02-08 ENCOUNTER — Ambulatory Visit (INDEPENDENT_AMBULATORY_CARE_PROVIDER_SITE_OTHER)

## 2024-02-08 ENCOUNTER — Encounter: Payer: Self-pay | Admitting: Emergency Medicine

## 2024-02-08 DIAGNOSIS — R051 Acute cough: Secondary | ICD-10-CM

## 2024-02-08 DIAGNOSIS — J189 Pneumonia, unspecified organism: Secondary | ICD-10-CM | POA: Diagnosis not present

## 2024-02-08 MED ORDER — AEROCHAMBER MV MISC
2 refills | Status: AC
Start: 2024-02-08 — End: ?

## 2024-02-08 MED ORDER — BENZONATATE 100 MG PO CAPS
200.0000 mg | ORAL_CAPSULE | Freq: Three times a day (TID) | ORAL | 0 refills | Status: DC
Start: 2024-02-08 — End: 2024-03-08

## 2024-02-08 MED ORDER — PROMETHAZINE-DM 6.25-15 MG/5ML PO SYRP: 5.0000 mL | ORAL_SOLUTION | Freq: Four times a day (QID) | ORAL | 0 refills | Status: DC | PRN

## 2024-02-08 MED ORDER — ALBUTEROL SULFATE HFA 108 (90 BASE) MCG/ACT IN AERS
2.0000 | INHALATION_SPRAY | RESPIRATORY_TRACT | 0 refills | Status: AC | PRN
Start: 2024-02-08 — End: ?

## 2024-02-08 NOTE — ED Provider Notes (Signed)
 [facility]    CSN: [id number] Arrival date & time: 02/08/24  9082      History   Chief Complaint Chief Complaint  Patient presents with   Cough   Chest Pain    HPI HEITOR STEINHOFF is a 63 y.o. male.   HPI  63 year old male with past medical history significant for adult hypothyroidism, GERD, prediabetes, high triglycerides, and vitamin D deficiency presents for evaluation of productive cough with shortness breath and wheezing that has been going on for the past week.  3 days ago he developed tightness in the center of his chest and pain when he coughs.  He denies any measured fever at home or upper respiratory symptoms.  He does not smoke and states he has never smoked.  Past Medical History:  Diagnosis Date   Asthma    COVID-19 09/2019   Diabetes mellitus without complication (HCC)    Dyspnea    with exertion   GERD (gastroesophageal reflux disease)    History of kidney stones    Hypothyroid 2013   Pneumonia 2021   Rotator cuff tendinitis, left 2025   Rupture of left distal biceps tendon 2025   Sarcoidosis    Tubular adenoma of colon     Patient Active Problem List   Diagnosis Date Noted   Acute hypoxemic respiratory failure due to COVID-19 (HCC) 09/30/2019   Chest pain 09/30/2019   Acute respiratory failure with hypoxia (HCC) 09/30/2019   Rotator cuff tendinitis, left 06/28/2018   Rupture of left distal biceps tendon 01/21/2018   Elevated alanine aminotransferase (ALT) level 06/18/2015   Hypertriglyceridemia 06/18/2015   Pre-diabetes 06/18/2015   Vitamin D deficiency 06/18/2015   Gastroesophageal reflux disease without esophagitis 03/12/2015   Hydrocele 01/30/2014   Adult hypothyroidism 12/30/2013    Past Surgical History:  Procedure Laterality Date   COLONOSCOPY WITH PROPOFOL  N/A 10/19/2016   Procedure: COLONOSCOPY WITH PROPOFOL ;  Surgeon: Gladis RAYMOND Mariner, MD;  Location: [facility] ENDOSCOPY;  Service: Endoscopy;  Laterality: N/A;   DISTAL  BICEPS TENDON REPAIR Left 01/23/2018   Procedure: PRIMARY DISTAL BICEPS TENDON REPAIR TENDON RUPTURE;  Surgeon: Edie Norleen PARAS, MD;  Location: [facility];  Service: Orthopedics;  Laterality: Left;  C ARM OR FLUOROSCAN UNIT BIOMET TOGGLE LOK DEVICE   ESOPHAGOGASTRODUODENOSCOPY (EGD) WITH PROPOFOL  N/A 10/19/2016   Procedure: ESOPHAGOGASTRODUODENOSCOPY (EGD) WITH PROPOFOL ;  Surgeon: Gladis RAYMOND Mariner, MD;  Location: [facility] ENDOSCOPY;  Service: Endoscopy;  Laterality: N/A;   POSTERIOR LUMBAR FUSION 2 WITH HARDWARE REMOVAL Left 10/31/2023   Procedure: ARTHROSCOPY, SHOULDER WITH DEBRIDEMENT;  Surgeon: Edie Norleen PARAS, MD;  Location: [facility] ORS;  Service: Orthopedics;  Laterality: Left;   SHOULDER SURGERY Left 10/13/2023   VIDEO BRONCHOSCOPY WITH ENDOBRONCHIAL NAVIGATION N/A 10/01/2020   Procedure: VIDEO BRONCHOSCOPY WITH ENDOBRONCHIAL NAVIGATION;  Surgeon: Parris Manna, MD;  Location: [facility] ORS;  Service: Thoracic;  Laterality: N/A;   VIDEO BRONCHOSCOPY WITH ENDOBRONCHIAL ULTRASOUND N/A 10/01/2020   Procedure: VIDEO BRONCHOSCOPY WITH ENDOBRONCHIAL ULTRASOUND;  Surgeon: Parris Manna, MD;  Location: [facility] ORS;  Service: Thoracic;  Laterality: N/A;       Home Medications    Prior to Admission medications   Medication Sig Start Date End Date Taking? Authorizing Provider  acetaminophen  (TYLENOL ) 500 MG tablet Take 1,000 mg by mouth every 6 (six) hours as needed for moderate pain (pain score 4-6).   Yes [provider]  ADVAIR DISKUS 250-50 MCG/DOSE AEPB Inhale 1 puff into the lungs in the morning and at bedtime. 09/02/20  Yes [provider]  albuterol  (VENTOLIN  HFA) 108 (90 Base) MCG/ACT inhaler Inhale 2 puffs into the lungs every 4 (four) hours as needed. 02/08/24  Yes Bernardino Ditch, NP  atorvastatin (LIPITOR) 10 MG tablet Take 10 mg by mouth at bedtime. 08/16/20  Yes [provider]  benzonatate  (TESSALON ) 100 MG capsule Take 2 capsules (200 mg total) by mouth every 8  (eight) hours. 02/08/24  Yes Bernardino Ditch, NP  Cholecalciferol (VITAMIN D3) 50 MCG (2000 UT) TABS Take 2,000 Units by mouth daily before lunch.   Yes [provider]  levothyroxine  (SYNTHROID ) 75 MCG tablet Take 75 mcg by mouth daily before breakfast. 07/18/19  Yes [provider]  metFORMIN (GLUCOPHAGE-XR) 500 MG 24 hr tablet Take 1,000 mg by mouth daily in the afternoon. 08/16/20  Yes [provider]  montelukast (SINGULAIR) 10 MG tablet Take 10 mg by mouth at bedtime.   Yes [provider]  Multiple Vitamin (MULTIVITAMIN WITH MINERALS) TABS tablet Take 1 tablet by mouth daily before lunch. One-A-Day   Yes [provider]  omeprazole (PRILOSEC) 40 MG capsule Take 40 mg by mouth daily before breakfast. 09/13/20  Yes [provider]  oxyCODONE  (ROXICODONE ) 5 MG immediate release tablet Take 1-2 tablets (5-10 mg total) by mouth every 4 (four) hours as needed for moderate pain (pain score 4-6) or severe pain (pain score 7-10). 10/31/23  Yes Poggi, Norleen PARAS, MD  promethazine -dextromethorphan  (PROMETHAZINE -DM) 6.25-15 MG/5ML syrup Take 5 mLs by mouth 4 (four) times daily as needed. 02/08/24  Yes Bernardino Ditch, NP  Spacer/Aero-Holding Chambers (AEROCHAMBER MV) inhaler Use as instructed 02/08/24  Yes Bernardino Ditch, NP    Family History History reviewed. No pertinent family history.  Social History Social History   Tobacco Use   Smoking status: Never   Smokeless tobacco: Never  Vaping Use   Vaping status: Never Used  Substance Use Topics   Alcohol use: No   Drug use: No     Allergies   Nsaids and Amoxicillin   Review of Systems Review of Systems  Constitutional:  Negative for fever.  HENT:  Negative for congestion and rhinorrhea.   Respiratory:  Positive for cough, shortness of breath and wheezing.      Physical Exam Triage Vital Signs ED Triage Vitals  Encounter Vitals Group     BP      Girls Systolic BP Percentile      Girls  Diastolic BP Percentile      Boys Systolic BP Percentile      Boys Diastolic BP Percentile      Pulse      Resp      Temp      Temp src      SpO2      Weight      Height      Head Circumference      Peak Flow      Pain Score      Pain Loc      Pain Education      Exclude from Growth Chart    No data found.  Updated Vital Signs BP (!) 136/92 (BP Location: Left Arm)   Pulse 67   Temp 98.8 F (37.1 C) (Oral)   Ht 5' 7 (1.702 m)   Wt 200 lb (90.7 kg)   SpO2 99%   BMI 31.32 kg/m   Visual Acuity Right Eye Distance:   Left Eye Distance:   Bilateral Distance:    Right Eye Near:  Left Eye Near:    Bilateral Near:     Physical Exam Vitals and nursing note reviewed.  Constitutional:      Appearance: Normal appearance. He is not ill-appearing.  HENT:     Head: Normocephalic and atraumatic.   Cardiovascular:     Rate and Rhythm: Normal rate and regular rhythm.     Pulses: Normal pulses.     Heart sounds: Normal heart sounds. No murmur heard.    No friction rub. No gallop.  Pulmonary:     Effort: Pulmonary effort is normal.     Breath sounds: Normal breath sounds. No wheezing, rhonchi or rales.   Skin:    General: Skin is warm and dry.     Capillary Refill: Capillary refill takes less than 2 seconds.     Findings: No rash.   Neurological:     General: No focal deficit present.     Mental Status: He is alert and oriented to person, place, and time.      UC Treatments / Results  Labs (all labs ordered are listed, but only abnormal results are displayed) Labs Reviewed - No data to display  EKG Normal sinus rhythm with a ventricular rate of 72 bpm PR interval 164 ms QRS duration 92 ms QT/QTc 410/440 ms No ST or T wave abnormalities noted.  Radiology DG Chest 2 View Result Date: 02/08/2024 CLINICAL DATA:  Productive cough EXAM: CHEST - 2 VIEW COMPARISON:  X-ray 07/19/2020.  CT 09/28/2022.  Older exams as well FINDINGS: No pneumothorax or effusion.  Normal cardiopericardial silhouette. No consolidation. Subtle lung base opacities identified which are predominantly linear. Some subtle nodular areas identified along the left lung base. No edema. Degenerative changes. IMPRESSION: Subtle lung base opacities identified which are predominantly linear but there is some nodular components at the left lung base. These were not seen on the prior examination. Recommend follow up x-ray in 6 weeks versus CT. Electronically Signed   By: Ranell Bring M.D.   On: 02/08/2024 10:19    Procedures Procedures (including critical care time)  Medications Ordered in UC Medications - No data to display  Initial Impression / Assessment and Plan / UC Course  I have reviewed the triage vital signs and the nursing notes.  Pertinent labs & imaging results that were available during my care of the patient were reviewed by me and considered in my medical decision making (see chart for details).   Patient is a pleasant, nontoxic-appearing 63 year old male presenting for evaluation of 1 week with a lower respiratory symptoms as outlined HPI.  In the exam room he is able to speak in full sentences no dyspnea or tachypnea.  Respiratory rate is approximately 18 and his oxygen saturation on room air is 99%.  He is afebrile at 98.8.  He reports that he has had a cough and shortness of the wheezing for 1 week and 3 days ago he developed some pain with coughing in the middle of his chest as well as chest tightness.  Cardiopulmonary exam reveals S1-S2 heart sounds with regular rate and rhythm and lungs are clear to auscultation in all fields.  EKG shows normal sinus rhythm without any ST or T wave abnormalities.  Differential diagnose include COVID, influenza, viral respiratory illness, pneumonia.  Given he has had symptoms for a week, not tested for COVID or influenza at this time.  However, I will obtain a chest x-ray to assess for any acute cardiopulmonary pathology.  Chest x-ray  independently reviewed  and evaluated by me.  Impression: Lung fields are well aerated.  There is a streaky opacity in the anterior segment of the left lung base near the lingula that could possibly represent scarring versus pneumonia.  Cardiomediastinal silhouette otherwise appears normal.  Radiology overread is pending. Radiology impression states there are subtle lung base opacities which are prominently linear with some nodular components of the left lung base.  These were not seen on the prior examination.  Recommend follow-up x-ray in 6 weeks versus CT.  I will discharge patient home with treatment for left lower lobe pneumonia.  He has documented allergy to amoxicillin that includes rash.  Therefore, I will discharge him home on Levaquin 500 milligrams daily for 7 days.  I will also prescribe an albuterol  inhaler and spacer and he can use 1 to 2 puffs every 4-6 hours as needed for shortness of breath or wheezing.  Tessalon  Perles Promethazine  DM cough syrup for cough and congestion.  I will have him follow-up with his primary care provider for repeat chest x-ray in 6 weeks.   Final Clinical Impressions(s) / UC Diagnoses   Final diagnoses:  Acute cough  Pneumonia of left lower lobe due to infectious organism     Discharge Instructions      Your chest x-ray shows some streaky opacities in your left lung base which could possibly represent pneumonia.  Take the Levaquin, 500 mg once daily with food, for 7 days for treatment of your pneumonia.  Use the albuterol  inhaler, with the spacer, and take 1 to 2 puffs every 4-6 hours as needed for shortness of breath or wheezing.  I am going to prescribe Tessalon  Perles that you can use during the day for cough.  Take them with a small sip of water.  They may give you numbness to the base of your tongue, or metallic taste in your mouth, this is normal.  You can have 1 every 8 hours as needed.  Use the Promethazine  DM cough syrup at bedtime as needed  for cough and congestion.  You need a repeat chest x-ray in 6 weeks to evaluate for resolution of the pneumonia.  Please make an appointment with your primary care provider.  If you develop any worsening shortness of breath, especially at rest, feel as though you cannot take a deep breath, or unable to speak in full sentences you need to call 911 and go to the ER.       ED Prescriptions     Medication Sig Dispense Auth. Provider   Spacer/Aero-Holding Chambers (AEROCHAMBER MV) inhaler Use as instructed 1 each Bernardino Ditch, NP   albuterol  (VENTOLIN  HFA) 108 (90 Base) MCG/ACT inhaler Inhale 2 puffs into the lungs every 4 (four) hours as needed. 18 g Bernardino Ditch, NP   benzonatate  (TESSALON ) 100 MG capsule Take 2 capsules (200 mg total) by mouth every 8 (eight) hours. 21 capsule Bernardino Ditch, NP   promethazine -dextromethorphan  (PROMETHAZINE -DM) 6.25-15 MG/5ML syrup Take 5 mLs by mouth 4 (four) times daily as needed. 118 mL Bernardino Ditch, NP      PDMP not reviewed this encounter.   Bernardino Ditch, NP 02/08/24 1047

## 2024-02-08 NOTE — Discharge Instructions (Addendum)
 Your chest x-ray shows some streaky opacities in your left lung base which could possibly represent pneumonia.  Take the Levaquin, 500 mg once daily with food, for 7 days for treatment of your pneumonia.  Use the albuterol  inhaler, with the spacer, and take 1 to 2 puffs every 4-6 hours as needed for shortness of breath or wheezing.  I am going to prescribe Tessalon  Perles that you can use during the day for cough.  Take them with a small sip of water.  They may give you numbness to the base of your tongue, or metallic taste in your mouth, this is normal.  You can have 1 every 8 hours as needed.  Use the Promethazine  DM cough syrup at bedtime as needed for cough and congestion.  You need a repeat chest x-ray in 6 weeks to evaluate for resolution of the pneumonia.  Please make an appointment with your primary care provider.  If you develop any worsening shortness of breath, especially at rest, feel as though you cannot take a deep breath, or unable to speak in full sentences you need to call 911 and go to the ER.

## 2024-02-08 NOTE — ED Triage Notes (Addendum)
 Pt declines an interpreter  Pt c/o cough and chest pain  Pt states that he has had a cough, chest congestion, and SOB for around a week.  Pt states that the chest pain started 3 days ago.  Pt describes the pain as muscle and chest tightness  Pt is worried about having pneumonia

## 2024-03-08 ENCOUNTER — Encounter: Payer: Self-pay | Admitting: Emergency Medicine

## 2024-03-08 ENCOUNTER — Ambulatory Visit
Admission: EM | Admit: 2024-03-08 | Discharge: 2024-03-08 | Disposition: A | Discharge status: Home / Self Care | Attending: Family Medicine | Admitting: Family Medicine

## 2024-03-08 DIAGNOSIS — J069 Acute upper respiratory infection, unspecified: Secondary | ICD-10-CM | POA: Diagnosis present

## 2024-03-08 DIAGNOSIS — J4541 Moderate persistent asthma with (acute) exacerbation: Secondary | ICD-10-CM | POA: Diagnosis not present

## 2024-03-08 LAB — SARS CORONAVIRUS 2 BY RT PCR: SARS Coronavirus 2 by RT PCR: NEGATIVE

## 2024-03-08 MED ORDER — PREDNISONE 50 MG PO TABS: 50.0000 mg | ORAL_TABLET | Freq: Every day | ORAL | 0 refills | Status: AC

## 2024-03-08 MED ORDER — PROMETHAZINE-DM 6.25-15 MG/5ML PO SYRP: 5.0000 mL | ORAL_SOLUTION | Freq: Four times a day (QID) | ORAL | 0 refills | Status: DC | PRN

## 2024-03-08 NOTE — ED Provider Notes (Signed)
 [facility]    CSN: [id number] Arrival date & time: 03/08/24  9167      History   Chief Complaint Chief Complaint  Patient presents with   Cough    HPI OCTAVIANO MUKAI is a 63 y.o. male.   HPI  History obtained from the patient. Harve presents for 3 days of  cough, chest congestion,  nasal congestion, rhinorrhea, sore throat, headache and bilateral flank pain with coughing. No vomiting, belly pain, diarrhea or fever.  Taking Theraflu night and day with some short-term relief.    Has history of asthma. Has been needing to use his albuterol  inhaler daily. Used prior to arrival. Takes his Advair.  Denies vaping and smoking.       Past Medical History:  Diagnosis Date   Asthma    COVID-19 09/2019   Diabetes mellitus without complication (HCC)    Dyspnea    with exertion   GERD (gastroesophageal reflux disease)    History of kidney stones    Hypothyroid 2013   Pneumonia 2021   Rotator cuff tendinitis, left 2025   Rupture of left distal biceps tendon 2025   Sarcoidosis    Tubular adenoma of colon     Patient Active Problem List   Diagnosis Date Noted   Acute hypoxemic respiratory failure due to COVID-19 (HCC) 09/30/2019   Chest pain 09/30/2019   Acute respiratory failure with hypoxia (HCC) 09/30/2019   Rotator cuff tendinitis, left 06/28/2018   Rupture of left distal biceps tendon 01/21/2018   Elevated alanine aminotransferase (ALT) level 06/18/2015   Hypertriglyceridemia 06/18/2015   Pre-diabetes 06/18/2015   Vitamin D deficiency 06/18/2015   Gastroesophageal reflux disease without esophagitis 03/12/2015   Hydrocele 01/30/2014   Adult hypothyroidism 12/30/2013    Past Surgical History:  Procedure Laterality Date   COLONOSCOPY WITH PROPOFOL  N/A 10/19/2016   Procedure: COLONOSCOPY WITH PROPOFOL ;  Surgeon: Gladis RAYMOND Mariner, MD;  Location: [facility] ENDOSCOPY;  Service: Endoscopy;  Laterality: N/A;   DISTAL BICEPS TENDON REPAIR Left 01/23/2018    Procedure: PRIMARY DISTAL BICEPS TENDON REPAIR TENDON RUPTURE;  Surgeon: Edie Norleen PARAS, MD;  Location: [facility];  Service: Orthopedics;  Laterality: Left;  C ARM OR FLUOROSCAN UNIT BIOMET TOGGLE LOK DEVICE   ESOPHAGOGASTRODUODENOSCOPY (EGD) WITH PROPOFOL  N/A 10/19/2016   Procedure: ESOPHAGOGASTRODUODENOSCOPY (EGD) WITH PROPOFOL ;  Surgeon: Gladis RAYMOND Mariner, MD;  Location: [facility]COPY;  Service: Endoscopy;  Laterality: N/A;   POSTERIOR LUMBAR FUSION 2 WITH HARDWARE REMOVAL Left 10/31/2023   Procedure: ARTHROSCOPY, SHOULDER WITH DEBRIDEMENT;  Surgeon: Edie Norleen PARAS, MD;  Location: [facility] ORS;  Service: Orthopedics;  Laterality: Left;   SHOULDER SURGERY Left 10/13/2023   VIDEO BRONCHOSCOPY WITH ENDOBRONCHIAL NAVIGATION N/A 10/01/2020   Procedure: VIDEO BRONCHOSCOPY WITH ENDOBRONCHIAL NAVIGATION;  Surgeon: Parris Manna, MD;  Location: [facility] ORS;  Service: Thoracic;  Laterality: N/A;   VIDEO BRONCHOSCOPY WITH ENDOBRONCHIAL ULTRASOUND N/A 10/01/2020   Procedure: VIDEO BRONCHOSCOPY WITH ENDOBRONCHIAL ULTRASOUND;  Surgeon: Parris Manna, MD;  Location: [facility] ORS;  Service: Thoracic;  Laterality: N/A;       Home Medications    Prior to Admission medications   Medication Sig Start Date End Date Taking? Authorizing Provider  acetaminophen  (TYLENOL ) 500 MG tablet Take 1,000 mg by mouth every 6 (six) hours as needed for moderate pain (pain score 4-6).    [provider]  ADVAIR DISKUS 250-50 MCG/DOSE AEPB Inhale 1 puff into the lungs in the morning and at bedtime. 09/02/20   [provider]  albuterol  (VENTOLIN  HFA) 108 (90 Base) MCG/ACT inhaler Inhale 2 puffs into the lungs every 4 (four) hours as needed. 02/08/24   Bernardino Ditch, NP  atorvastatin (LIPITOR) 10 MG tablet Take 10 mg by mouth at bedtime. 08/16/20   [provider]  benzonatate  (TESSALON ) 100 MG capsule Take 2 capsules (200 mg total) by mouth every 8 (eight) hours. 02/08/24   Bernardino Ditch, NP   Cholecalciferol (VITAMIN D3) 50 MCG (2000 UT) TABS Take 2,000 Units by mouth daily before lunch.    [provider]  levothyroxine  (SYNTHROID ) 75 MCG tablet Take 75 mcg by mouth daily before breakfast. 07/18/19   [provider]  metFORMIN (GLUCOPHAGE-XR) 500 MG 24 hr tablet Take 1,000 mg by mouth daily in the afternoon. 08/16/20   [provider]  montelukast (SINGULAIR) 10 MG tablet Take 10 mg by mouth at bedtime.    [provider]  Multiple Vitamin (MULTIVITAMIN WITH MINERALS) TABS tablet Take 1 tablet by mouth daily before lunch. One-A-Day    [provider]  omeprazole (PRILOSEC) 40 MG capsule Take 40 mg by mouth daily before breakfast. 09/13/20   [provider]  oxyCODONE  (ROXICODONE ) 5 MG immediate release tablet Take 1-2 tablets (5-10 mg total) by mouth every 4 (four) hours as needed for moderate pain (pain score 4-6) or severe pain (pain score 7-10). 10/31/23   Poggi, Norleen PARAS, MD  promethazine -dextromethorphan  (PROMETHAZINE -DM) 6.25-15 MG/5ML syrup Take 5 mLs by mouth 4 (four) times daily as needed. 02/08/24   Bernardino Ditch, NP  Spacer/Aero-Holding [place] (AEROCHAMBER MV) inhaler Use as instructed 02/08/24   Bernardino Ditch, NP    Family History History reviewed. No pertinent family history.  Social History Social History   Tobacco Use   Smoking status: Never   Smokeless tobacco: Never  Vaping Use   Vaping status: Never Used  Substance Use Topics   Alcohol use: No   Drug use: No     Allergies   Nsaids and Amoxicillin   Review of Systems Review of Systems: negative unless otherwise stated in HPI.      Physical Exam Triage Vital Signs ED Triage Vitals  Encounter Vitals Group     BP 03/08/24 0849 121/82     Girls Systolic BP Percentile --      Girls Diastolic BP Percentile --      Boys Systolic BP Percentile --      Boys Diastolic BP Percentile --      Pulse Rate 03/08/24 0849 82     Resp 03/08/24 0849 15     Temp  03/08/24 0849 98.4 F (36.9 C)     Temp Source 03/08/24 0849 Oral     SpO2 03/08/24 0849 96 %     Weight 03/08/24 0847 199 lb 15.3 oz (90.7 kg)     Height 03/08/24 0847 5' 7 (1.702 m)     Head Circumference --      Peak Flow --      Pain Score 03/08/24 0847 0     Pain Loc --      Pain Education --      Exclude from Growth Chart --    No data found.  Updated Vital Signs BP 121/82 (BP Location: Right Arm)   Pulse 82   Temp 98.4 F (36.9 C) (Oral)   Resp 15   Ht 5' 7 (1.702 m)   Wt 90.7 kg   SpO2 96%   BMI 31.32 kg/m   Visual Acuity Right Eye Distance:  Left Eye Distance:   Bilateral Distance:    Right Eye Near:   Left Eye Near:    Bilateral Near:     Physical Exam GEN:     alert, non-toxic appearing male in no distress ***   HENT:  mucus membranes moist, oropharyngeal ***without lesions or ***erythema, no*** tonsillar hypertrophy or exudates, *** moderate erythematous edematous turbinates, ***clear nasal discharge, ***bilateral TM normal EYES:   pupils equal and reactive, ***no scleral injection or discharge NECK:  normal ROM, no ***lymphadenopathy, ***no meningismus   RESP:  no increased work of breathing, ***clear to auscultation bilaterally CVS:   regular rate ***and rhythm Skin:   warm and dry, no rash on visible skin***    UC Treatments / Results  Labs (all labs ordered are listed, but only abnormal results are displayed) Labs Reviewed  SARS CORONAVIRUS 2 BY RT PCR    EKG   Radiology No results found.  Procedures Procedures (including critical care time)  Medications Ordered in UC Medications - No data to display  Initial Impression / Assessment and Plan / UC Course  I have reviewed the triage vital signs and the nursing notes.  Pertinent labs & imaging results that were available during my care of the patient were reviewed by me and considered in my medical decision making (see chart for details).       Pt is a 63 y.o. male who  presents for *** days of respiratory symptoms. Clee is ***afebrile here without recent antipyretics. Satting well on room air. Overall pt is ***non-toxic appearing, well hydrated, without respiratory distress. Pulmonary exam ***is unremarkable.  COVID and influenza panel obtained ***and was negative. ***Pt to quarantine until COVID test results or longer if positive.  I will call patient with test results, if positive. History consistent with ***viral respiratory illness. Discussed symptomatic treatment.  Explained lack of efficacy of antibiotics in viral disease.  Typical duration of symptoms discussed.   Return and ED precautions given and voiced understanding. Discussed MDM, treatment plan and plan for follow-up with patient*** who agrees with plan.     Final Clinical Impressions(s) / UC Diagnoses   Final diagnoses:  None   Discharge Instructions   None    ED Prescriptions   None    PDMP not reviewed this encounter.

## 2024-03-08 NOTE — Discharge Instructions (Addendum)
 Stop by the pharmacy to pick up your prescriptions.  Follow up with your primary care provider or return to the urgent care, if not improving.

## 2024-03-08 NOTE — ED Triage Notes (Signed)
Patient c/o cough and chest congestion for 3 days.  Patient denies fevers.  

## 2024-03-12 ENCOUNTER — Ambulatory Visit (INDEPENDENT_AMBULATORY_CARE_PROVIDER_SITE_OTHER)

## 2024-03-12 ENCOUNTER — Ambulatory Visit
Admission: EM | Admit: 2024-03-12 | Discharge: 2024-03-12 | Disposition: A | Discharge status: Home / Self Care | Attending: Family Medicine | Admitting: Family Medicine

## 2024-03-12 ENCOUNTER — Ambulatory Visit: Payer: Self-pay | Admitting: Family Medicine

## 2024-03-12 DIAGNOSIS — R051 Acute cough: Secondary | ICD-10-CM | POA: Insufficient documentation

## 2024-03-12 DIAGNOSIS — Z8701 Personal history of pneumonia (recurrent): Secondary | ICD-10-CM | POA: Insufficient documentation

## 2024-03-12 DIAGNOSIS — Z862 Personal history of diseases of the blood and blood-forming organs and certain disorders involving the immune mechanism: Secondary | ICD-10-CM | POA: Diagnosis present

## 2024-03-12 DIAGNOSIS — J189 Pneumonia, unspecified organism: Secondary | ICD-10-CM

## 2024-03-12 DIAGNOSIS — R9389 Abnormal findings on diagnostic imaging of other specified body structures: Secondary | ICD-10-CM | POA: Diagnosis present

## 2024-03-12 LAB — GROUP A STREP BY PCR: Group A Strep by PCR: NOT DETECTED

## 2024-03-12 MED ORDER — LEVOFLOXACIN 500 MG PO TABS: 500.0000 mg | ORAL_TABLET | Freq: Every day | ORAL | 0 refills | Status: DC

## 2024-03-12 MED ORDER — HYDROCOD POLI-CHLORPHE POLI ER 10-8 MG/5ML PO SUER: 5.0000 mL | Freq: Two times a day (BID) | ORAL | 0 refills | Status: AC | PRN

## 2024-03-12 NOTE — Discharge Instructions (Addendum)
 Your strep test was negative.  A chest xray was obtained today.  Stop by the pharmacy to pick up your prescriptions.  Follow up with your lung doctor tomorrow, as scheduled.  We obtained an xray to see if your pneumonia has resolved.  You may need a Chest CT.  Discuss your xray results with Dr Theotis tomorrow.

## 2024-03-12 NOTE — ED Provider Notes (Signed)
 [facility]    CSN: [id number] Arrival date & time: 03/12/24  9073      History   Chief Complaint Chief Complaint  Patient presents with   Cough   Sore Throat   Shortness of Breath    HPI Andrew Elliott is a 63 y.o. male.   HPI Pt declined Spanish interpreter.  History obtained from the patient and his wife  Andrew Elliott presents for respiratory symptoms over a the past week.  Was seen here 4 days ago for similar sx and had negative COVID test. Feels like he is swallowing glass. No fever, vomiting or diarrhea.  Continues to have nasal congestion.   Has been taking his cough medications and steroids without relief. Has history of asthma and his cough is getting worse. He is not able to sleep at night due to the cough. Had pneumonia recently. Has follow up with his lung doctor tomorrow.      Past Medical History:  Diagnosis Date   Asthma    COVID-19 09/2019   Diabetes mellitus without complication (HCC)    Dyspnea    with exertion   GERD (gastroesophageal reflux disease)    History of kidney stones    Hypothyroid 2013   Pneumonia 2021   Rotator cuff tendinitis, left 2025   Rupture of left distal biceps tendon 2025   Sarcoidosis    Tubular adenoma of colon     Patient Active Problem List   Diagnosis Date Noted   Acute hypoxemic respiratory failure due to COVID-19 (HCC) 09/30/2019   Chest pain 09/30/2019   Acute respiratory failure with hypoxia (HCC) 09/30/2019   Rotator cuff tendinitis, left 06/28/2018   Rupture of left distal biceps tendon 01/21/2018   Elevated alanine aminotransferase (ALT) level 06/18/2015   Hypertriglyceridemia 06/18/2015   Pre-diabetes 06/18/2015   Vitamin D deficiency 06/18/2015   Gastroesophageal reflux disease without esophagitis 03/12/2015   Hydrocele 01/30/2014   Adult hypothyroidism 12/30/2013    Past Surgical History:  Procedure Laterality Date   COLONOSCOPY WITH PROPOFOL  N/A 10/19/2016   Procedure: COLONOSCOPY WITH  PROPOFOL ;  Surgeon: Gladis RAYMOND Mariner, MD;  Location: [facility] ENDOSCOPY;  Service: Endoscopy;  Laterality: N/A;   DISTAL BICEPS TENDON REPAIR Left 01/23/2018   Procedure: PRIMARY DISTAL BICEPS TENDON REPAIR TENDON RUPTURE;  Surgeon: Edie Norleen PARAS, MD;  Location: [facility];  Service: Orthopedics;  Laterality: Left;  C ARM OR FLUOROSCAN UNIT BIOMET TOGGLE LOK DEVICE   ESOPHAGOGASTRODUODENOSCOPY (EGD) WITH PROPOFOL  N/A 10/19/2016   Procedure: ESOPHAGOGASTRODUODENOSCOPY (EGD) WITH PROPOFOL ;  Surgeon: Gladis RAYMOND Mariner, MD;  Location: [facility] ENDOSCOPY;  Service: Endoscopy;  Laterality: N/A;   POSTERIOR LUMBAR FUSION 2 WITH HARDWARE REMOVAL Left 10/31/2023   Procedure: ARTHROSCOPY, SHOULDER WITH DEBRIDEMENT;  Surgeon: Edie Norleen PARAS, MD;  Location: [facility] ORS;  Service: Orthopedics;  Laterality: Left;   SHOULDER SURGERY Left 10/13/2023   VIDEO BRONCHOSCOPY WITH ENDOBRONCHIAL NAVIGATION N/A 10/01/2020   Procedure: VIDEO BRONCHOSCOPY WITH ENDOBRONCHIAL NAVIGATION;  Surgeon: Parris Manna, MD;  Location: [facility] ORS;  Service: Thoracic;  Laterality: N/A;   VIDEO BRONCHOSCOPY WITH ENDOBRONCHIAL ULTRASOUND N/A 10/01/2020   Procedure: VIDEO BRONCHOSCOPY WITH ENDOBRONCHIAL ULTRASOUND;  Surgeon: Parris Manna, MD;  Location: [facility] ORS;  Service: Thoracic;  Laterality: N/A;       Home Medications    Prior to Admission medications   Medication Sig Start Date End Date Taking? Authorizing Provider  atorvastatin (LIPITOR) 10 MG tablet Take 10 mg by mouth at bedtime. 08/16/20  Yes [provider]  chlorpheniramine-HYDROcodone  (TUSSIONEX) 10-8 MG/5ML Take 5 mLs by mouth every 12 (twelve) hours as needed. 03/12/24  Yes Karsten Howry, DO  levofloxacin  (LEVAQUIN ) 500 MG tablet Take 1 tablet (500 mg total) by mouth daily. 03/12/24  Yes Teagen Mcleary, DO  levothyroxine  (SYNTHROID ) 75 MCG tablet Take 75 mcg by mouth daily before breakfast. 07/18/19  Yes [provider]  metFORMIN (GLUCOPHAGE-XR)  500 MG 24 hr tablet Take 1,000 mg by mouth daily in the afternoon. 08/16/20  Yes [provider]  acetaminophen  (TYLENOL ) 500 MG tablet Take 1,000 mg by mouth every 6 (six) hours as needed for moderate pain (pain score 4-6).    [provider]  ADVAIR DISKUS 250-50 MCG/DOSE AEPB Inhale 1 puff into the lungs in the morning and at bedtime. 09/02/20   [provider]  albuterol  (VENTOLIN  HFA) 108 (90 Base) MCG/ACT inhaler Inhale 2 puffs into the lungs every 4 (four) hours as needed. 02/08/24   Bernardino Ditch, NP  Cholecalciferol (VITAMIN D3) 50 MCG (2000 UT) TABS Take 2,000 Units by mouth daily before lunch.    [provider]  montelukast (SINGULAIR) 10 MG tablet Take 10 mg by mouth at bedtime.    [provider]  Multiple Vitamin (MULTIVITAMIN WITH MINERALS) TABS tablet Take 1 tablet by mouth daily before lunch. One-A-Day    [provider]  omeprazole (PRILOSEC) 40 MG capsule Take 40 mg by mouth daily before breakfast. 09/13/20   [provider]  oxyCODONE  (ROXICODONE ) 5 MG immediate release tablet Take 1-2 tablets (5-10 mg total) by mouth every 4 (four) hours as needed for moderate pain (pain score 4-6) or severe pain (pain score 7-10). 10/31/23   Poggi, Norleen PARAS, MD  predniSONE  (DELTASONE ) 50 MG tablet Take 1 tablet (50 mg total) by mouth daily for 5 days. 03/08/24 03/13/24  Martine Bleecker, DO  Spacer/Aero-Holding Chambers (AEROCHAMBER MV) inhaler Use as instructed 02/08/24   Bernardino Ditch, NP    Family History History reviewed. No pertinent family history.  Social History Social History   Tobacco Use   Smoking status: Never   Smokeless tobacco: Never  Vaping Use   Vaping status: Never Used  Substance Use Topics   Alcohol use: No   Drug use: No     Allergies   Nsaids and Amoxicillin   Review of Systems Review of Systems: negative unless otherwise stated in HPI.      Physical Exam Triage Vital Signs ED Triage Vitals   Encounter Vitals Group     BP 03/12/24 1008 119/80     Girls Systolic BP Percentile --      Girls Diastolic BP Percentile --      Boys Systolic BP Percentile --      Boys Diastolic BP Percentile --      Pulse Rate 03/12/24 1008 90     Resp 03/12/24 1008 (!) 26     Temp 03/12/24 1008 98.7 F (37.1 C)     Temp Source 03/12/24 1008 Oral     SpO2 03/12/24 1008 98 %     Weight --      Height --      Head Circumference --      Peak Flow --      Pain Score 03/12/24 1007 7     Pain Loc --      Pain Education --      Exclude from Growth Chart --    No data found.  Updated Vital Signs BP 119/80 (BP Location: Left  Arm)   Pulse 90   Temp 98.7 F (37.1 C) (Oral)   Resp (!) 26   SpO2 98%   Visual Acuity Right Eye Distance:   Left Eye Distance:   Bilateral Distance:    Right Eye Near:   Left Eye Near:    Bilateral Near:     Physical Exam GEN:     alert, non-toxic appearing male in no distress    HENT:  mucus membranes moist, oropharyngeal without lesions, mild erythema, no tonsillar hypertrophy or exudates, clear nasal discharge EYES:   no scleral injection or discharge RESP:  coarse breathe sounds bilaterally, tachypnea CVS:   regular rate and rhythm Skin:   warm and dry    UC Treatments / Results  Labs (all labs ordered are listed, but only abnormal results are displayed) Labs Reviewed  GROUP A STREP BY PCR    EKG   Radiology DG Chest 2 View Result Date: 03/12/2024 CLINICAL DATA:  Cough. EXAM: CHEST - 2 VIEW COMPARISON:  Chest radiograph dated 02/08/2024. FINDINGS: No focal consolidation, pleural effusion, pneumothorax. Minimal bibasilar scarring. The cardiac silhouette is within normal limits. No acute osseous pathology. IMPRESSION: No active cardiopulmonary disease. Electronically Signed   By: Vanetta Chou M.D.   On: 03/12/2024 12:02    Procedures Procedures (including critical care time)  Medications Ordered in UC Medications - No data to  display  Initial Impression / Assessment and Plan / UC Course  I have reviewed the triage vital signs and the nursing notes.  Pertinent labs & imaging results that were available during my care of the patient were reviewed by me and considered in my medical decision making (see chart for details).       Pt is a 63 y.o. male who has history of sarcoidosis and asthma presents for persistent cough that is not improving.  Stefen is  afebrile here without recent antipyretics. Satting well on room air. Overall pt is  non-toxic appearing, well hydrated, without respiratory distress. Pulmonary exam is coarse breathe sounds with cough.  After shared decision making, we will pursue chest x-ray. Respiratory testing deferred due to length of symptoms. COVID testing was negative a few days ago here at the urgent care.   Treat acute bronchitis  with  antibiotics as below.  Continue prednisone  taper. Tussionex cough syrup given for cough and allow patient to rest.  Typical duration of symptoms discussed. Pt has follow up with Dr Theotis, pulmonologist, tomorrow.  Return and ED precautions given and patient voiced understanding.   Discussed MDM, treatment plan and plan for follow-up with patient who agrees with plan.      Final Clinical Impressions(s) / UC Diagnoses   Final diagnoses:  Acute cough  Abnormal chest x-ray  History of sarcoidosis  History of recent pneumonia     Discharge Instructions      Your strep test was negative.  A chest xray was obtained today.  Stop by the pharmacy to pick up your prescriptions.  Follow up with your lung doctor tomorrow, as scheduled.  We obtained an xray to see if your pneumonia has resolved.  You may need a Chest CT.  Discuss your xray results with Dr Theotis tomorrow.      ED Prescriptions     Medication Sig Dispense Auth. Provider   chlorpheniramine-HYDROcodone  (TUSSIONEX) 10-8 MG/5ML Take 5 mLs by mouth every 12 (twelve) hours as needed. 115 mL  Shawnna Pancake, DO   levofloxacin  (LEVAQUIN ) 500 MG tablet Take 1 tablet (  500 mg total) by mouth daily. 7 tablet Danaiya Steadman, DO      I have reviewed the PDMP during this encounter.   Kriste Berth, DO 03/12/24 1331

## 2024-03-12 NOTE — ED Triage Notes (Addendum)
 Patient states that he came in on 7-26 with same sx. Patient states that he has 1 pill of prednisone  left. Patient states that his sx have gotten worse, patient states that he's coughing more and that makes his chest hurt, patient states that he has a headache and sore throat and is having SOB Patient asked to be tested for strep

## 2024-05-09 ENCOUNTER — Ambulatory Visit (INDEPENDENT_AMBULATORY_CARE_PROVIDER_SITE_OTHER)

## 2024-05-09 ENCOUNTER — Encounter: Payer: Self-pay | Admitting: Emergency Medicine

## 2024-05-09 ENCOUNTER — Ambulatory Visit
Admission: EM | Admit: 2024-05-09 | Discharge: 2024-05-09 | Disposition: A | Discharge status: Home / Self Care | Attending: Physician Assistant | Admitting: Physician Assistant

## 2024-05-09 DIAGNOSIS — J069 Acute upper respiratory infection, unspecified: Secondary | ICD-10-CM | POA: Diagnosis present

## 2024-05-09 DIAGNOSIS — R52 Pain, unspecified: Secondary | ICD-10-CM | POA: Insufficient documentation

## 2024-05-09 DIAGNOSIS — R051 Acute cough: Secondary | ICD-10-CM | POA: Insufficient documentation

## 2024-05-09 DIAGNOSIS — D869 Sarcoidosis, unspecified: Secondary | ICD-10-CM | POA: Insufficient documentation

## 2024-05-09 DIAGNOSIS — R0981 Nasal congestion: Secondary | ICD-10-CM | POA: Insufficient documentation

## 2024-05-09 LAB — RESP PANEL BY RT-PCR (FLU A&B, COVID) ARPGX2
Influenza A by PCR: NEGATIVE
Influenza B by PCR: NEGATIVE
SARS Coronavirus 2 by RT PCR: NEGATIVE

## 2024-05-09 MED ORDER — PROMETHAZINE-DM 6.25-15 MG/5ML PO SYRP: 5.0000 mL | ORAL_SOLUTION | Freq: Four times a day (QID) | ORAL | 0 refills | Status: AC | PRN

## 2024-05-09 MED ORDER — BENZONATATE 200 MG PO CAPS: 200.0000 mg | ORAL_CAPSULE | Freq: Three times a day (TID) | ORAL | 0 refills | Status: AC | PRN

## 2024-05-09 NOTE — ED Triage Notes (Signed)
 Patient c/o cough, runny nose, congestion, and bodyaches that started 3 days ago.  Patient unsure of fevers.

## 2024-05-09 NOTE — Discharge Instructions (Signed)

## 2024-05-09 NOTE — ED Provider Notes (Signed)
 [facility]    CSN: [id number] Arrival date & time: 05/09/24  9176      History   Chief Complaint Chief Complaint  Patient presents with   Cough   Nasal Congestion    HPI Andrew Elliott is a 63 y.o. male with history of asthma, sarcoidosis, GERD, diabetes, dyspnea on exertion, and hypothyroidism.  Patient presents today for 3-day history of feeling fatigued with chills, cough, congestion, body aches, and runny nose.  Denies recorded fever. Denies sore throat, sinus pain, chest pain, shortness of breath, abdominal pain, vomiting or diarrhea. His daughter and granddaughter have been ill as well.  Has taken OTC tylenol  and Theraflu.  HPI  Past Medical History:  Diagnosis Date   Asthma    COVID-19 09/2019   Diabetes mellitus without complication (HCC)    Dyspnea    with exertion   GERD (gastroesophageal reflux disease)    History of kidney stones    Hypothyroid 2013   Pneumonia 2021   Rotator cuff tendinitis, left 2025   Rupture of left distal biceps tendon 2025   Sarcoidosis    Tubular adenoma of colon     Patient Active Problem List   Diagnosis Date Noted   Acute hypoxemic respiratory failure due to COVID-19 (HCC) 09/30/2019   Chest pain 09/30/2019   Acute respiratory failure with hypoxia (HCC) 09/30/2019   Rotator cuff tendinitis, left 06/28/2018   Rupture of left distal biceps tendon 01/21/2018   Elevated alanine aminotransferase (ALT) level 06/18/2015   Hypertriglyceridemia 06/18/2015   Pre-diabetes 06/18/2015   Vitamin D deficiency 06/18/2015   Gastroesophageal reflux disease without esophagitis 03/12/2015   Hydrocele 01/30/2014   Adult hypothyroidism 12/30/2013    Past Surgical History:  Procedure Laterality Date   COLONOSCOPY WITH PROPOFOL  N/A 10/19/2016   Procedure: COLONOSCOPY WITH PROPOFOL ;  Surgeon: Gladis RAYMOND Mariner, MD;  Location: [facility] ENDOSCOPY;  Service: Endoscopy;  Laterality: N/A;   DISTAL BICEPS TENDON REPAIR Left 01/23/2018    Procedure: PRIMARY DISTAL BICEPS TENDON REPAIR TENDON RUPTURE;  Surgeon: Edie Norleen PARAS, MD;  Location: [facility];  Service: Orthopedics;  Laterality: Left;  C ARM OR FLUOROSCAN UNIT BIOMET TOGGLE LOK DEVICE   ESOPHAGOGASTRODUODENOSCOPY (EGD) WITH PROPOFOL  N/A 10/19/2016   Procedure: ESOPHAGOGASTRODUODENOSCOPY (EGD) WITH PROPOFOL ;  Surgeon: Gladis RAYMOND Mariner, MD;  Location: [facility] ENDOSCOPY;  Service: Endoscopy;  Laterality: N/A;   POSTERIOR LUMBAR FUSION 2 WITH HARDWARE REMOVAL Left 10/31/2023   Procedure: ARTHROSCOPY, SHOULDER WITH DEBRIDEMENT;  Surgeon: Edie Norleen PARAS, MD;  Location: [facility] ORS;  Service: Orthopedics;  Laterality: Left;   SHOULDER SURGERY Left 10/13/2023   VIDEO BRONCHOSCOPY WITH ENDOBRONCHIAL NAVIGATION N/A 10/01/2020   Procedure: VIDEO BRONCHOSCOPY WITH ENDOBRONCHIAL NAVIGATION;  Surgeon: Parris Manna, MD;  Location: [facility] ORS;  Service: Thoracic;  Laterality: N/A;   VIDEO BRONCHOSCOPY WITH ENDOBRONCHIAL ULTRASOUND N/A 10/01/2020   Procedure: VIDEO BRONCHOSCOPY WITH ENDOBRONCHIAL ULTRASOUND;  Surgeon: Parris Manna, MD;  Location: [facility] ORS;  Service: Thoracic;  Laterality: N/A;       Home Medications    Prior to Admission medications   Medication Sig Start Date End Date Taking? Authorizing Provider  benzonatate  (TESSALON ) 200 MG capsule Take 1 capsule (200 mg total) by mouth 3 (three) times daily as needed. 05/09/24  Yes Arvis Jolan NOVAK, PA-C  promethazine -dextromethorphan  (PROMETHAZINE -DM) 6.25-15 MG/5ML syrup Take 5 mLs by mouth 4 (four) times daily as needed. 05/09/24  Yes Arvis Jolan B, PA-C  acetaminophen  (TYLENOL ) 500 MG tablet Take 1,000 mg by mouth every 6 (  six) hours as needed for moderate pain (pain score 4-6).    [provider]  ADVAIR DISKUS 250-50 MCG/DOSE AEPB Inhale 1 puff into the lungs in the morning and at bedtime. 09/02/20   [provider]  albuterol  (VENTOLIN  HFA) 108 (90 Base) MCG/ACT inhaler Inhale 2 puffs into the lungs  every 4 (four) hours as needed. 02/08/24   Bernardino Ditch, NP  atorvastatin (LIPITOR) 10 MG tablet Take 10 mg by mouth at bedtime. 08/16/20   [provider]  chlorpheniramine-HYDROcodone  (TUSSIONEX) 10-8 MG/5ML Take 5 mLs by mouth every 12 (twelve) hours as needed. 03/12/24   Brimage, Vondra, DO  Cholecalciferol (VITAMIN D3) 50 MCG (2000 UT) TABS Take 2,000 Units by mouth daily before lunch.    [provider]  levothyroxine  (SYNTHROID ) 75 MCG tablet Take 75 mcg by mouth daily before breakfast. 07/18/19   [provider]  metFORMIN (GLUCOPHAGE-XR) 500 MG 24 hr tablet Take 1,000 mg by mouth daily in the afternoon. 08/16/20   [provider]  montelukast (SINGULAIR) 10 MG tablet Take 10 mg by mouth at bedtime.    [provider]  Multiple Vitamin (MULTIVITAMIN WITH MINERALS) TABS tablet Take 1 tablet by mouth daily before lunch. One-A-Day    [provider]  omeprazole (PRILOSEC) 40 MG capsule Take 40 mg by mouth daily before breakfast. 09/13/20   [provider]  oxyCODONE  (ROXICODONE ) 5 MG immediate release tablet Take 1-2 tablets (5-10 mg total) by mouth every 4 (four) hours as needed for moderate pain (pain score 4-6) or severe pain (pain score 7-10). 10/31/23   Poggi, Norleen PARAS, MD  Spacer/Aero-Holding Chambers (AEROCHAMBER MV) inhaler Use as instructed 02/08/24   Bernardino Ditch, NP    Family History History reviewed. No pertinent family history.  Social History Social History   Tobacco Use   Smoking status: Never   Smokeless tobacco: Never  Vaping Use   Vaping status: Never Used  Substance Use Topics   Alcohol use: No   Drug use: No     Allergies   Nsaids and Amoxicillin   Review of Systems Review of Systems  Constitutional:  Positive for chills and fatigue. Negative for fever.  HENT:  Positive for congestion and rhinorrhea. Negative for sinus pressure, sinus pain and sore throat.   Respiratory:  Positive for cough. Negative for  shortness of breath.   Cardiovascular:  Negative for chest pain.  Gastrointestinal:  Negative for abdominal pain, diarrhea, nausea and vomiting.  Musculoskeletal:  Positive for myalgias.  Neurological:  Negative for weakness, light-headedness and headaches.  Hematological:  Negative for adenopathy.     Physical Exam Triage Vital Signs ED Triage Vitals  Encounter Vitals Group     BP 05/09/24 0836 116/75     Girls Systolic BP Percentile --      Girls Diastolic BP Percentile --      Boys Systolic BP Percentile --      Boys Diastolic BP Percentile --      Pulse Rate 05/09/24 0836 70     Resp 05/09/24 0836 15     Temp 05/09/24 0836 98.8 F (37.1 C)     Temp Source 05/09/24 0836 Oral     SpO2 05/09/24 0836 97 %     Weight 05/09/24 0834 199 lb 15.3 oz (90.7 kg)     Height 05/09/24 0834 5' 7 (1.702 m)     Head Circumference --      Peak Flow --      Pain  Score 05/09/24 0834 4     Pain Loc --      Pain Education --      Exclude from Growth Chart --    No data found.  Updated Vital Signs BP 116/75 (BP Location: Right Arm)   Pulse 70   Temp 98.8 F (37.1 C) (Oral)   Resp 15   Ht 5' 7 (1.702 m)   Wt 199 lb 15.3 oz (90.7 kg)   SpO2 97%   BMI 31.32 kg/m      Physical Exam Vitals and nursing note reviewed.  Constitutional:      General: He is not in acute distress.    Appearance: Normal appearance. He is well-developed. He is not ill-appearing.  HENT:     Head: Normocephalic and atraumatic.     Nose: Congestion present.     Mouth/Throat:     Mouth: Mucous membranes are moist.     Pharynx: Oropharynx is clear.  Eyes:     General: No scleral icterus.    Conjunctiva/sclera: Conjunctivae normal.  Cardiovascular:     Rate and Rhythm: Normal rate and regular rhythm.  Pulmonary:     Effort: Pulmonary effort is normal. No respiratory distress.     Breath sounds: Normal breath sounds.  Musculoskeletal:     Cervical back: Neck supple.  Skin:    General: Skin is warm and  dry.     Capillary Refill: Capillary refill takes less than 2 seconds.  Neurological:     General: No focal deficit present.     Mental Status: He is alert. Mental status is at baseline.     Motor: No weakness.     Gait: Gait normal.  Psychiatric:        Mood and Affect: Mood normal.        Behavior: Behavior normal.      UC Treatments / Results  Labs (all labs ordered are listed, but only abnormal results are displayed) Labs Reviewed  RESP PANEL BY RT-PCR (FLU A&B, COVID) [id number]    EKG   Radiology DG Chest 2 View Result Date: 05/09/2024 CLINICAL DATA:  Cough, congestion with body aches 3 days. History of sarcoidosis. EXAM: CHEST - 2 VIEW COMPARISON:  03/12/2024 FINDINGS: Lungs are adequately inflated with minimal increase interstitial prominence over the lung bases which may be slightly more prominent compared to the prior exam as this is a chronic finding. There is no acute airspace process or effusion. Cardiomediastinal silhouette and remainder of the exam is unchanged. IMPRESSION: 1. No acute cardiopulmonary disease. 2. Minimal increase interstitial prominence over the lung bases slightly more apparent compared to 03/12/2024. This may represent chronic progressive interstitial disease related to patient's sarcoidosis. Electronically Signed   By: Toribio Agreste M.D.   On: 05/09/2024 09:16    Procedures Procedures (including critical care time)  Medications Ordered in UC Medications - No data to display  Initial Impression / Assessment and Plan / UC Course  I have reviewed the triage vital signs and the nursing notes.  Pertinent labs & imaging results that were available during my care of the patient were reviewed by me and considered in my medical decision making (see chart for details).   63 year old male with history of asthma, sarcoidosis, hypothyroidism and diabetes presents for chills, body aches, cough, congestion and runny nose x 3 days.  Denies fever, chest pain or  shortness of breath.  Vitals are all stable and normal and patient is overall well-appearing.  He is in  no acute distress.  On exam he has nasal congestion.  Throat is clear.  Chest clear.  Heart regular rate and rhythm.  Respiratory panel and chest x-ray obtained. Negative resp panel.   CXR does not show obvious pneumonia.  Viral URI.  Supportive care encouraged with increasing rest and fluids.  Sent Promethazine  DM and benzonatate  to pharmacy.  Encouraged him to return if fever, chest pain, increased breathing difficulty or wheezing or if not feeling better over the next 1 to 2 weeks.   Final Clinical Impressions(s) / UC Diagnoses   Final diagnoses:  Acute cough  Viral upper respiratory tract infection  Nasal congestion  Sarcoidosis  Body aches     Discharge Instructions      URI/COLD SYMPTOMS: Your exam today is consistent with a viral illness. Antibiotics are not indicated at this time. Use medications as directed, including cough syrup, nasal saline, and decongestants. Your symptoms should improve over the next few days and resolve within 7-10 days. Increase rest and fluids. F/u if symptoms worsen or predominate such as sore throat, ear pain, productive cough, shortness of breath, or if you develop high fevers or worsening fatigue over the next several days.       ED Prescriptions     Medication Sig Dispense Auth. Provider   promethazine -dextromethorphan  (PROMETHAZINE -DM) 6.25-15 MG/5ML syrup Take 5 mLs by mouth 4 (four) times daily as needed. 118 mL Arvis Huxley B, PA-C   benzonatate  (TESSALON ) 200 MG capsule Take 1 capsule (200 mg total) by mouth 3 (three) times daily as needed. 30 capsule Arvis Huxley NOVAK, PA-C      PDMP not reviewed this encounter.   Arvis Huxley NOVAK, PA-C 05/09/24 [phone]

## 2024-08-19 ENCOUNTER — Encounter: Payer: Self-pay | Admitting: Surgery
